# Patient Record
Sex: Male | Born: 1945 | Race: White | Hispanic: No | Marital: Married | Smoking: Current every day smoker
Health system: Southern US, Community
[De-identification: ages and names within clinical notes are randomized; demographics above are authoritative.]

## PROBLEM LIST (undated history)

## (undated) DIAGNOSIS — J45909 Unspecified asthma, uncomplicated: Secondary | ICD-10-CM

## (undated) DIAGNOSIS — K635 Polyp of colon: Secondary | ICD-10-CM

## (undated) DIAGNOSIS — M1990 Unspecified osteoarthritis, unspecified site: Secondary | ICD-10-CM

## (undated) DIAGNOSIS — R0902 Hypoxemia: Secondary | ICD-10-CM

## (undated) DIAGNOSIS — R030 Elevated blood-pressure reading, without diagnosis of hypertension: Secondary | ICD-10-CM

## (undated) DIAGNOSIS — E785 Hyperlipidemia, unspecified: Secondary | ICD-10-CM

## (undated) DIAGNOSIS — M810 Age-related osteoporosis without current pathological fracture: Secondary | ICD-10-CM

## (undated) DIAGNOSIS — IMO0002 Reserved for concepts with insufficient information to code with codable children: Secondary | ICD-10-CM

## (undated) DIAGNOSIS — R040 Epistaxis: Secondary | ICD-10-CM

## (undated) DIAGNOSIS — J449 Chronic obstructive pulmonary disease, unspecified: Secondary | ICD-10-CM

## (undated) DIAGNOSIS — E559 Vitamin D deficiency, unspecified: Secondary | ICD-10-CM

## (undated) HISTORY — DX: Unspecified osteoarthritis, unspecified site: M19.90

## (undated) HISTORY — PX: BILATERAL KNEE ARTHROSCOPY: SUR91

## (undated) HISTORY — DX: Age-related osteoporosis without current pathological fracture: M81.0

## (undated) HISTORY — DX: Epistaxis: R04.0

## (undated) HISTORY — DX: Unspecified asthma, uncomplicated: J45.909

## (undated) HISTORY — DX: Elevated blood-pressure reading, without diagnosis of hypertension: R03.0

## (undated) HISTORY — DX: Reserved for concepts with insufficient information to code with codable children: IMO0002

## (undated) HISTORY — PX: PROSTATE BIOPSY: SHX241

## (undated) HISTORY — DX: Hypoxemia: R09.02

## (undated) HISTORY — DX: Hyperlipidemia, unspecified: E78.5

## (undated) HISTORY — DX: Chronic obstructive pulmonary disease, unspecified: J44.9

## (undated) HISTORY — PX: COLONOSCOPY: SHX174

## (undated) HISTORY — PX: OTHER SURGICAL HISTORY: SHX169

## (undated) HISTORY — DX: Polyp of colon: K63.5

## (undated) HISTORY — DX: Vitamin D deficiency, unspecified: E55.9

---

## 1998-08-28 ENCOUNTER — Emergency Department (HOSPITAL_COMMUNITY): Admission: EM | Admit: 1998-08-28 | Discharge: 1998-08-28 | Payer: Self-pay | Admitting: Emergency Medicine

## 1998-08-29 ENCOUNTER — Encounter: Payer: Self-pay | Admitting: Emergency Medicine

## 2000-04-06 ENCOUNTER — Ambulatory Visit (HOSPITAL_COMMUNITY): Admission: RE | Admit: 2000-04-06 | Discharge: 2000-04-06 | Payer: Self-pay | Admitting: Gastroenterology

## 2003-08-18 ENCOUNTER — Encounter: Admission: RE | Admit: 2003-08-18 | Discharge: 2003-08-18 | Payer: Self-pay | Admitting: Family Medicine

## 2003-08-26 ENCOUNTER — Encounter: Admission: RE | Admit: 2003-08-26 | Discharge: 2003-08-26 | Payer: Self-pay | Admitting: Family Medicine

## 2004-01-13 ENCOUNTER — Encounter: Admission: RE | Admit: 2004-01-13 | Discharge: 2004-01-13 | Payer: Self-pay | Admitting: Family Medicine

## 2004-01-29 ENCOUNTER — Ambulatory Visit: Payer: Self-pay | Admitting: Internal Medicine

## 2004-02-26 ENCOUNTER — Ambulatory Visit: Payer: Self-pay | Admitting: Internal Medicine

## 2004-12-23 ENCOUNTER — Encounter: Admission: RE | Admit: 2004-12-23 | Discharge: 2004-12-23 | Payer: Self-pay | Admitting: Family Medicine

## 2005-09-20 ENCOUNTER — Ambulatory Visit: Payer: Self-pay | Admitting: Family Medicine

## 2005-09-27 ENCOUNTER — Ambulatory Visit: Payer: Self-pay | Admitting: Family Medicine

## 2006-10-03 ENCOUNTER — Ambulatory Visit: Payer: Self-pay | Admitting: Family Medicine

## 2006-10-08 ENCOUNTER — Ambulatory Visit: Payer: Self-pay | Admitting: Gastroenterology

## 2006-10-16 ENCOUNTER — Encounter: Payer: Self-pay | Admitting: Gastroenterology

## 2006-10-16 ENCOUNTER — Ambulatory Visit: Payer: Self-pay | Admitting: Gastroenterology

## 2007-09-02 ENCOUNTER — Ambulatory Visit: Payer: Self-pay | Admitting: Family Medicine

## 2007-10-03 ENCOUNTER — Ambulatory Visit: Payer: Self-pay | Admitting: Family Medicine

## 2007-10-07 ENCOUNTER — Ambulatory Visit: Payer: Self-pay | Admitting: Family Medicine

## 2007-10-07 ENCOUNTER — Encounter: Admission: RE | Admit: 2007-10-07 | Discharge: 2007-10-07 | Payer: Self-pay | Admitting: Family Medicine

## 2008-05-18 ENCOUNTER — Encounter: Admission: RE | Admit: 2008-05-18 | Discharge: 2008-05-18 | Payer: Self-pay | Admitting: Family Medicine

## 2008-05-18 ENCOUNTER — Ambulatory Visit: Payer: Self-pay | Admitting: Family Medicine

## 2008-06-08 ENCOUNTER — Ambulatory Visit: Payer: Self-pay | Admitting: Family Medicine

## 2008-06-25 ENCOUNTER — Ambulatory Visit: Payer: Self-pay | Admitting: Internal Medicine

## 2008-06-25 DIAGNOSIS — J439 Emphysema, unspecified: Secondary | ICD-10-CM

## 2008-07-20 ENCOUNTER — Ambulatory Visit: Payer: Self-pay | Admitting: Internal Medicine

## 2008-08-06 ENCOUNTER — Ambulatory Visit: Payer: Self-pay | Admitting: Family Medicine

## 2008-08-06 ENCOUNTER — Encounter: Admission: RE | Admit: 2008-08-06 | Discharge: 2008-08-06 | Payer: Self-pay | Admitting: Family Medicine

## 2008-10-09 ENCOUNTER — Ambulatory Visit: Payer: Self-pay | Admitting: Family Medicine

## 2008-10-28 ENCOUNTER — Ambulatory Visit: Payer: Self-pay | Admitting: Family Medicine

## 2008-11-03 ENCOUNTER — Ambulatory Visit: Payer: Self-pay | Admitting: Internal Medicine

## 2008-12-07 ENCOUNTER — Ambulatory Visit: Payer: Self-pay | Admitting: Family Medicine

## 2008-12-28 ENCOUNTER — Ambulatory Visit: Payer: Self-pay | Admitting: Family Medicine

## 2009-01-04 ENCOUNTER — Ambulatory Visit: Payer: Self-pay | Admitting: Family Medicine

## 2009-01-13 ENCOUNTER — Encounter: Payer: Self-pay | Admitting: Internal Medicine

## 2009-01-13 ENCOUNTER — Encounter: Admission: RE | Admit: 2009-01-13 | Discharge: 2009-01-13 | Payer: Self-pay | Admitting: Family Medicine

## 2009-01-21 ENCOUNTER — Ambulatory Visit: Payer: Self-pay | Admitting: Family Medicine

## 2009-01-26 ENCOUNTER — Encounter: Admission: RE | Admit: 2009-01-26 | Discharge: 2009-01-26 | Payer: Self-pay | Admitting: Family Medicine

## 2009-03-03 ENCOUNTER — Ambulatory Visit: Payer: Self-pay | Admitting: Internal Medicine

## 2009-03-03 DIAGNOSIS — F17200 Nicotine dependence, unspecified, uncomplicated: Secondary | ICD-10-CM

## 2009-07-02 ENCOUNTER — Ambulatory Visit: Payer: Self-pay | Admitting: Internal Medicine

## 2009-10-11 ENCOUNTER — Ambulatory Visit: Payer: Self-pay | Admitting: Physician Assistant

## 2009-10-25 ENCOUNTER — Ambulatory Visit: Payer: Self-pay | Admitting: Family Medicine

## 2009-10-27 ENCOUNTER — Ambulatory Visit: Payer: Self-pay | Admitting: Internal Medicine

## 2009-11-16 ENCOUNTER — Ambulatory Visit: Payer: Self-pay | Admitting: Family Medicine

## 2010-02-21 ENCOUNTER — Ambulatory Visit
Admission: RE | Admit: 2010-02-21 | Discharge: 2010-02-21 | Payer: Self-pay | Source: Home / Self Care | Attending: Family Medicine | Admitting: Family Medicine
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
## 2010-02-22 NOTE — Miscellaneous (Signed)
Summary: Orders Update pft charges  Clinical Lists Changes  Orders: Added new Service order of Carbon Monoxide diffusing w/capacity (94720) - Signed Added new Service order of Lung Volumes (94240) - Signed Added new Service order of Spirometry (Pre & Post) (94060) - Signed 

## 2010-03-04 ENCOUNTER — Ambulatory Visit
Admission: RE | Admit: 2010-03-04 | Discharge: 2010-03-04 | Disposition: A | Discharge status: Home / Self Care | Payer: Managed Care, Other (non HMO) | Source: Ambulatory Visit | Attending: Internal Medicine | Admitting: Internal Medicine

## 2010-03-04 ENCOUNTER — Other Ambulatory Visit: Payer: Self-pay | Admitting: Internal Medicine

## 2010-03-04 MED ORDER — IOHEXOL 300 MG/ML  SOLN
100.0000 mL | Freq: Once | INTRAMUSCULAR | Status: AC | PRN
  Administered 2010-03-04: 100 mL via INTRAVENOUS

## 2010-03-15 ENCOUNTER — Other Ambulatory Visit: Payer: Self-pay | Admitting: Internal Medicine

## 2010-03-15 ENCOUNTER — Ambulatory Visit
Admission: RE | Admit: 2010-03-15 | Discharge: 2010-03-15 | Disposition: A | Discharge status: Home / Self Care | Payer: Managed Care, Other (non HMO) | Source: Ambulatory Visit | Attending: Internal Medicine | Admitting: Internal Medicine

## 2010-03-15 DIAGNOSIS — R079 Chest pain, unspecified: Secondary | ICD-10-CM

## 2010-03-25 ENCOUNTER — Other Ambulatory Visit: Payer: Self-pay | Admitting: Oncology

## 2010-03-25 ENCOUNTER — Encounter (HOSPITAL_BASED_OUTPATIENT_CLINIC_OR_DEPARTMENT_OTHER): Payer: Managed Care, Other (non HMO) | Admitting: Oncology

## 2010-03-25 DIAGNOSIS — D72829 Elevated white blood cell count, unspecified: Secondary | ICD-10-CM

## 2010-03-25 LAB — CBC WITH DIFFERENTIAL/PLATELET
BASO%: 0.4 % (ref 0.0–2.0)
EOS%: 4.5 % (ref 0.0–7.0)
Eosinophils Absolute: 0.3 10*3/uL (ref 0.0–0.5)
LYMPH%: 20.8 % (ref 14.0–49.0)
MCH: 32.4 pg (ref 27.2–33.4)
MCHC: 33.9 g/dL (ref 32.0–36.0)
MCV: 95.4 fL (ref 79.3–98.0)
MONO%: 13.3 % (ref 0.0–14.0)
NEUT#: 3.6 10*3/uL (ref 1.5–6.5)
Platelets: 166 10*3/uL (ref 140–400)
RBC: 4.03 10*6/uL — ABNORMAL LOW (ref 4.20–5.82)
RDW: 13.4 % (ref 11.0–14.6)

## 2010-03-25 LAB — COMPREHENSIVE METABOLIC PANEL
ALT: 19 U/L (ref 0–53)
AST: 22 U/L (ref 0–37)
Alkaline Phosphatase: 91 U/L (ref 39–117)
CO2: 28 mEq/L (ref 19–32)
Creatinine, Ser: 1.18 mg/dL (ref 0.40–1.50)
Sodium: 143 mEq/L (ref 135–145)
Total Bilirubin: 0.4 mg/dL (ref 0.3–1.2)
Total Protein: 7.1 g/dL (ref 6.0–8.3)

## 2010-03-25 LAB — CHCC SMEAR

## 2010-03-25 LAB — LACTATE DEHYDROGENASE: LDH: 187 U/L (ref 94–250)

## 2010-04-01 ENCOUNTER — Other Ambulatory Visit: Payer: Self-pay | Admitting: Internal Medicine

## 2010-04-01 ENCOUNTER — Ambulatory Visit
Admission: RE | Admit: 2010-04-01 | Discharge: 2010-04-01 | Disposition: A | Discharge status: Home / Self Care | Payer: Managed Care, Other (non HMO) | Source: Ambulatory Visit | Attending: Internal Medicine | Admitting: Internal Medicine

## 2010-04-01 DIAGNOSIS — R0602 Shortness of breath: Secondary | ICD-10-CM

## 2010-04-01 MED ORDER — IOHEXOL 300 MG/ML  SOLN
75.0000 mL | Freq: Once | INTRAMUSCULAR | Status: AC | PRN
  Administered 2010-04-01: 75 mL via INTRAVENOUS

## 2010-04-05 ENCOUNTER — Other Ambulatory Visit (HOSPITAL_COMMUNITY): Payer: Self-pay | Admitting: Internal Medicine

## 2010-04-05 DIAGNOSIS — S22009A Unspecified fracture of unspecified thoracic vertebra, initial encounter for closed fracture: Secondary | ICD-10-CM

## 2010-04-05 DIAGNOSIS — Z0000 Encounter for general adult medical examination without abnormal findings: Secondary | ICD-10-CM

## 2010-04-08 ENCOUNTER — Ambulatory Visit (HOSPITAL_COMMUNITY)
Admission: RE | Admit: 2010-04-08 | Discharge: 2010-04-08 | Disposition: A | Discharge status: Home / Self Care | Payer: Managed Care, Other (non HMO) | Source: Ambulatory Visit | Attending: Internal Medicine | Admitting: Internal Medicine

## 2010-04-08 DIAGNOSIS — Z0000 Encounter for general adult medical examination without abnormal findings: Secondary | ICD-10-CM

## 2010-04-08 DIAGNOSIS — S22009A Unspecified fracture of unspecified thoracic vertebra, initial encounter for closed fracture: Secondary | ICD-10-CM | POA: Insufficient documentation

## 2010-04-15 ENCOUNTER — Other Ambulatory Visit (HOSPITAL_COMMUNITY): Payer: Self-pay | Admitting: Orthopedic Surgery

## 2010-04-15 DIAGNOSIS — IMO0002 Reserved for concepts with insufficient information to code with codable children: Secondary | ICD-10-CM

## 2010-04-24 HISTORY — PX: BACK SURGERY: SHX140

## 2010-04-27 ENCOUNTER — Ambulatory Visit (HOSPITAL_COMMUNITY): Admission: RE | Admit: 2010-04-27 | Payer: Managed Care, Other (non HMO) | Source: Ambulatory Visit

## 2010-04-27 ENCOUNTER — Ambulatory Visit (HOSPITAL_COMMUNITY)
Admission: RE | Admit: 2010-04-27 | Discharge: 2010-04-27 | Disposition: A | Discharge status: Home / Self Care | Payer: Managed Care, Other (non HMO) | Source: Ambulatory Visit | Attending: Orthopedic Surgery | Admitting: Orthopedic Surgery

## 2010-04-27 DIAGNOSIS — Y929 Unspecified place or not applicable: Secondary | ICD-10-CM | POA: Insufficient documentation

## 2010-04-27 DIAGNOSIS — IMO0002 Reserved for concepts with insufficient information to code with codable children: Secondary | ICD-10-CM

## 2010-04-27 DIAGNOSIS — S22009A Unspecified fracture of unspecified thoracic vertebra, initial encounter for closed fracture: Secondary | ICD-10-CM | POA: Insufficient documentation

## 2010-04-27 DIAGNOSIS — X58XXXA Exposure to other specified factors, initial encounter: Secondary | ICD-10-CM | POA: Insufficient documentation

## 2010-04-27 DIAGNOSIS — M546 Pain in thoracic spine: Secondary | ICD-10-CM | POA: Insufficient documentation

## 2010-04-27 MED ORDER — TECHNETIUM TC 99M MEDRONATE IV KIT
23.6000 | PACK | Freq: Once | INTRAVENOUS | Status: AC | PRN
  Administered 2010-04-27: 23.6 via INTRAVENOUS

## 2010-05-04 ENCOUNTER — Encounter (HOSPITAL_COMMUNITY)
Admission: RE | Admit: 2010-05-04 | Discharge: 2010-05-04 | Disposition: A | Discharge status: Home / Self Care | Payer: Managed Care, Other (non HMO) | Source: Ambulatory Visit | Attending: Orthopedic Surgery | Admitting: Orthopedic Surgery

## 2010-05-04 LAB — DIFFERENTIAL
Eosinophils Relative: 5 % (ref 0–5)
Lymphocytes Relative: 22 % (ref 12–46)
Lymphs Abs: 1.7 10*3/uL (ref 0.7–4.0)
Monocytes Relative: 16 % — ABNORMAL HIGH (ref 3–12)

## 2010-05-04 LAB — COMPREHENSIVE METABOLIC PANEL
ALT: 16 U/L (ref 0–53)
Albumin: 4.1 g/dL (ref 3.5–5.2)
Alkaline Phosphatase: 68 U/L (ref 39–117)
Chloride: 104 mEq/L (ref 96–112)
Glucose, Bld: 79 mg/dL (ref 70–99)
Potassium: 4.6 mEq/L (ref 3.5–5.1)
Sodium: 140 mEq/L (ref 135–145)
Total Bilirubin: 0.8 mg/dL (ref 0.3–1.2)
Total Protein: 6.6 g/dL (ref 6.0–8.3)

## 2010-05-04 LAB — PROTIME-INR
INR: 0.93 (ref 0.00–1.49)
Prothrombin Time: 12.7 seconds (ref 11.6–15.2)

## 2010-05-04 LAB — CBC
HCT: 41 % (ref 39.0–52.0)
Hemoglobin: 14 g/dL (ref 13.0–17.0)
MCHC: 34.1 g/dL (ref 30.0–36.0)
MCV: 95.1 fL (ref 78.0–100.0)
RDW: 13 % (ref 11.5–15.5)

## 2010-05-04 LAB — SURGICAL PCR SCREEN

## 2010-05-05 ENCOUNTER — Ambulatory Visit (HOSPITAL_COMMUNITY): Payer: Managed Care, Other (non HMO)

## 2010-05-05 ENCOUNTER — Ambulatory Visit (HOSPITAL_COMMUNITY)
Admission: RE | Admit: 2010-05-05 | Discharge: 2010-05-05 | Disposition: A | Discharge status: Home / Self Care | Payer: Managed Care, Other (non HMO) | Source: Ambulatory Visit | Attending: Orthopedic Surgery | Admitting: Orthopedic Surgery

## 2010-05-05 DIAGNOSIS — Y929 Unspecified place or not applicable: Secondary | ICD-10-CM | POA: Insufficient documentation

## 2010-05-05 DIAGNOSIS — J449 Chronic obstructive pulmonary disease, unspecified: Secondary | ICD-10-CM | POA: Insufficient documentation

## 2010-05-05 DIAGNOSIS — J4489 Other specified chronic obstructive pulmonary disease: Secondary | ICD-10-CM | POA: Insufficient documentation

## 2010-05-05 DIAGNOSIS — X58XXXA Exposure to other specified factors, initial encounter: Secondary | ICD-10-CM | POA: Insufficient documentation

## 2010-05-05 DIAGNOSIS — S22009A Unspecified fracture of unspecified thoracic vertebra, initial encounter for closed fracture: Secondary | ICD-10-CM | POA: Insufficient documentation

## 2010-05-05 LAB — URINALYSIS, ROUTINE W REFLEX MICROSCOPIC
Bilirubin Urine: NEGATIVE
Glucose, UA: NEGATIVE mg/dL
Ketones, ur: NEGATIVE mg/dL
Protein, ur: NEGATIVE mg/dL
pH: 6.5 (ref 5.0–8.0)

## 2010-05-06 LAB — TYPE AND SCREEN
DAT, IgG: NEGATIVE
PT AG Type: NEGATIVE
Unit division: 0

## 2010-05-06 NOTE — Op Note (Signed)
NAMECamerin, [name]                   ACCOUNT NO.:  [id number]  MEDICAL RECORD NO.:  [id number]           PATIENT TYPE:  O  LOCATION:  [facility]                         FACILITY:  [facility]  PHYSICIAN:  Estill Bamberg, MD      DATE OF BIRTH:  13-Oct-1945  DATE OF PROCEDURE:  05/05/2010 DATE OF DISCHARGE:  05/05/2010                              OPERATIVE REPORT   PREOPERATIVE DIAGNOSIS:  T8 compression fracture.  POSTOPERATIVE DIAGNOSIS:  T8 compression fracture.  PROCEDURE:  T8 kyphoplasty.  SURGEON:  Estill Bamberg, MD  ASSISTANT:  None.  ANESTHESIA:  General endotracheal anesthesia.  ESTIMATED BLOOD LOSS:  Minimal.  INDICATIONS FOR PROCEDURE:  Briefly, [name] is an extremely pleasant 65 year old male who presented to my office with severe pain in his low back.  The patient stated that his pain began in approximately beginning of February.  He was unsure of the etiology.  However, I did review a CAT scan which was consistent with a T8 and a T12 compression fracture. I was unsure as to the acuity of each fracture and I therefore did go forward with a bone scan.  The bone scan did show activity at the T8 vertebral body.  The T12 vertebral body did not show any activity and therefore I did diagnose him with an acute T8 compression fracture.  I did discuss continued conservative treatment versus nonoperative measures and the patient wished to go forward with kyphoplasty.  The patient did understand the risks and limitations of the procedure as outlined in my preoperative note.  OPERATIVE DETAILS:  On May 04, 2009, the patient was brought to surgery and general endotracheal anesthesia was administered.  The patient was placed prone on a well-padded Jackson frame.  Bumps were placed over the patient's chest and hips.  AP and lateral fluoroscopy was brought in.  The T8 vertebral body was visualized well on both AP and lateral views.  I then made two stab incisions at the  superior lateral aspect of the pedicles at the T8 level.  Jamshidi needles were advanced through the T8 pedicles on both the right and the left sides in the usual manner.  The kyphoplasty balloons were inflated with approximately 2 mL of contrast.  It did take a high degree of pressure to get a 2 mL of contrast.  It did appear that the patient's bone was rather sclerotic.  Of note, I did use a curette and a drill prior to going forward with inflation of the balloons.  Once the pressure of the balloons reached 400 bilaterally, I removed the balloons and cement was advanced down the pedicles.  A total of approximately 1 mL of cement on both the right and the left sides were used.  I did note excellent interdigitation of the spine and T8 vertebral body.  There was no extravasation of the cement identified posteriorly or any significant extent to the vertebral disks above and below.  I then observed the patient for additional 5 minutes while the cement was hardening.  The Jamshidi were removed.  Final AP and lateral fluoroscopic views were obtained.  The incisions were closed using Monocryl and Dermabond.  The patient was then awoken from general endotracheal anesthesia and transferred to recovery in stable condition.     Estill Bamberg, MD     MD/MEDQ  D:  05/05/2010  T:  05/06/2010  Job:  [id number]  Electronically Signed by Estill Bamberg  on 05/06/2010 09:10:47 AM

## 2010-06-22 ENCOUNTER — Encounter: Payer: Self-pay | Admitting: Internal Medicine

## 2010-06-23 ENCOUNTER — Ambulatory Visit (INDEPENDENT_AMBULATORY_CARE_PROVIDER_SITE_OTHER): Payer: BC Managed Care – PPO | Admitting: Internal Medicine

## 2010-06-23 ENCOUNTER — Encounter: Payer: Self-pay | Admitting: Internal Medicine

## 2010-06-23 VITALS — BP 120/82 | HR 98 | Ht 71.0 in | Wt 153.2 lb

## 2010-06-23 DIAGNOSIS — M4850XA Collapsed vertebra, not elsewhere classified, site unspecified, initial encounter for fracture: Secondary | ICD-10-CM

## 2010-06-23 DIAGNOSIS — F17200 Nicotine dependence, unspecified, uncomplicated: Secondary | ICD-10-CM

## 2010-06-23 DIAGNOSIS — J449 Chronic obstructive pulmonary disease, unspecified: Secondary | ICD-10-CM

## 2010-06-23 DIAGNOSIS — IMO0002 Reserved for concepts with insufficient information to code with codable children: Secondary | ICD-10-CM

## 2010-06-23 DIAGNOSIS — B370 Candidal stomatitis: Secondary | ICD-10-CM

## 2010-06-23 MED ORDER — IPRATROPIUM-ALBUTEROL 0.5-2.5 (3) MG/3ML IN SOLN: 3.0000 mL | RESPIRATORY_TRACT | Status: DC | PRN

## 2010-06-23 MED ORDER — MOMETASONE FUROATE 50 MCG/ACT NA SUSP: 2.0000 | Freq: Every day | NASAL | Status: DC

## 2010-06-23 MED ORDER — FLUCONAZOLE 150 MG PO TABS: 150.0000 mg | ORAL_TABLET | Freq: Once | ORAL | Status: AC

## 2010-06-23 NOTE — Assessment & Plan Note (Addendum)
Down to 2 cigs/ day- trying to quit. Support and reinforcement given.

## 2010-06-23 NOTE — Assessment & Plan Note (Addendum)
Thrush likely explains current hoarseness that waxes and wanes. We will give diflucan

## 2010-06-23 NOTE — Progress Notes (Addendum)
  Subjective:    Patient ID: [name], male    DOB: August 08, 1945, 66 y.o.   MRN: [id number]  HPI 06/23/10- 61 yo tobacco user 1/2 ppd, followed for COPD   here with dtr. Last here October 27, 2009. Since then had back surgery -did ok. For last 2-3 weeks had been tighter through chest, difficult to clear thick mucus without fever, green or sore throat. Just feels he can't clear mucus. Has had 2 steroid shots for this this in past month. Taking doxycycline and just finished prednisone. Neb with albuterol opens him only transiently. DOE but comfortable at rest. Denies chest pain. Has had a chest CT at [facility]. CT w/cm 04/01/10- emphysema, NAD, left apical scarring, compression fx's T8, T12 Nuclear bone scan 04/27/10- uptake c/w T8 vertebral compression fx. Review of Systems Constitutional:   No weight loss, night sweats,  Fevers, chills, fatigue, lassitude. HEENT:   No headaches,  Difficulty swallowing,  Tooth/dental problems,  Sore throat,                No sneezing, itching, ear ache, nasal congestion, post nasal drip,   CV:  No chest pain,  Orthopnea, PND, swelling in lower extremities, anasarca, dizziness, palpitations  GI  No heartburn, indigestion, abdominal pain, nausea, vomiting, diarrhea, change in bowel habits, loss of appetite  Resp:.  No excess mucus, no productive cough,  Pos- non-productive cough,  No coughing up of blood.  No change in color of mucus.  No wheezing.    Skin: no rash or lesions.  GU: no dysuria, change in color of urine, no urgency or frequency.  No flank pain.  MS:  No joint pain or swelling.  No decreased range of motion.   Psych:  No change in mood or affect. No depression or anxiety.  No memory loss.      Objective:   Physical Exam General- Alert, Oriented, Affect-appropriate, Distress- none acute  Skin- rash-none, lesions- none, excoriation- none  Lymphadenopathy- none  Head- atraumatic  Eyes- Gross vision intact, PERRLA, conjunctivae clear  secretions  Ears- Hearing, canals, Tm- normal  Nose- Clear, No- Septal dev, mucus, polyps, erosion, perforation   Throat- Mallampati II , mucosa clear , drainage- none, tonsils- atrophic  Very hoarse  With Thrush  Neck- flexible , trachea midline, no stridor , thyroid nl, carotid no bruit  Chest - symmetrical excursion , unlabored     Heart/CV- RRR , no murmur , no gallop  , no rub, nl s1 s2                     - JVD- none , edema- none, stasis changes- none, varices- none     Lung- diminished with rhonchi right >left chest, unlabored.                 wheeze- none, cough- none , dullness-none, rub- none     Chest wall-  Abd- tender-no, distended-no, bowel sounds-present, HSM- no  Br/ Gen/ Rectal- Not done, not indicated  Extrem- cyanosis- none, clubbing, none, atrophy- none, strength- nl  Neuro- grossly intact to observation          Assessment & Plan:

## 2010-06-23 NOTE — Patient Instructions (Signed)
Please don't smoke!  Scripts for fluconazole for thrush/ yeast infection in mouth, Nasonex and nebulizer solution sent to your drug store.   Finish the antibiotic.

## 2010-06-23 NOTE — Assessment & Plan Note (Addendum)
Moderately severe COPD with subacute exacerbation felt as tightness with little response to current meds.  We discussed potential role of thoracic volume loss associated with vertebral compression fxs as contributing to dyspnea.

## 2010-06-25 DIAGNOSIS — M4850XA Collapsed vertebra, not elsewhere classified, site unspecified, initial encounter for fracture: Secondary | ICD-10-CM | POA: Insufficient documentation

## 2010-07-08 ENCOUNTER — Other Ambulatory Visit: Payer: Self-pay | Admitting: Internal Medicine

## 2010-07-19 ENCOUNTER — Telehealth: Payer: Self-pay | Admitting: Internal Medicine

## 2010-07-19 MED ORDER — TIOTROPIUM BROMIDE MONOHYDRATE 18 MCG IN CAPS: 18.0000 ug | ORAL_CAPSULE | Freq: Every day | RESPIRATORY_TRACT | Status: DC

## 2010-07-19 MED ORDER — BUDESONIDE-FORMOTEROL FUMARATE 160-4.5 MCG/ACT IN AERO: 2.0000 | INHALATION_SPRAY | Freq: Two times a day (BID) | RESPIRATORY_TRACT | Status: DC

## 2010-07-19 MED ORDER — ALBUTEROL SULFATE HFA 108 (90 BASE) MCG/ACT IN AERS: 2.0000 | INHALATION_SPRAY | Freq: Four times a day (QID) | RESPIRATORY_TRACT | Status: DC | PRN

## 2010-07-19 NOTE — Telephone Encounter (Signed)
Rx refills for spiriva, symbicort and proair sent to pharm. Spoke with pt and notified that this has been done.

## 2010-09-24 LAB — HM DEXA SCAN

## 2010-10-26 ENCOUNTER — Ambulatory Visit: Payer: Self-pay | Admitting: Internal Medicine

## 2011-05-29 ENCOUNTER — Other Ambulatory Visit: Payer: Self-pay | Admitting: Internal Medicine

## 2011-07-06 ENCOUNTER — Ambulatory Visit (INDEPENDENT_AMBULATORY_CARE_PROVIDER_SITE_OTHER): Payer: BC Managed Care – PPO | Admitting: Internal Medicine

## 2011-07-06 ENCOUNTER — Ambulatory Visit (INDEPENDENT_AMBULATORY_CARE_PROVIDER_SITE_OTHER)
Admission: RE | Admit: 2011-07-06 | Discharge: 2011-07-06 | Disposition: A | Discharge status: Home / Self Care | Payer: BC Managed Care – PPO | Source: Ambulatory Visit | Attending: Internal Medicine | Admitting: Internal Medicine

## 2011-07-06 ENCOUNTER — Encounter: Payer: Self-pay | Admitting: Internal Medicine

## 2011-07-06 VITALS — BP 128/90 | HR 80 | Ht 71.0 in | Wt 156.2 lb

## 2011-07-06 DIAGNOSIS — B370 Candidal stomatitis: Secondary | ICD-10-CM

## 2011-07-06 DIAGNOSIS — J449 Chronic obstructive pulmonary disease, unspecified: Secondary | ICD-10-CM

## 2011-07-06 DIAGNOSIS — J4489 Other specified chronic obstructive pulmonary disease: Secondary | ICD-10-CM

## 2011-07-06 DIAGNOSIS — F17200 Nicotine dependence, unspecified, uncomplicated: Secondary | ICD-10-CM

## 2011-07-06 MED ORDER — MOMETASONE FUROATE 50 MCG/ACT NA SUSP: 2.0000 | NASAL | Status: DC | PRN

## 2011-07-06 MED ORDER — IPRATROPIUM-ALBUTEROL 0.5-2.5 (3) MG/3ML IN SOLN: 3.0000 mL | RESPIRATORY_TRACT | Status: DC | PRN

## 2011-07-06 MED ORDER — ALBUTEROL SULFATE HFA 108 (90 BASE) MCG/ACT IN AERS: 2.0000 | INHALATION_SPRAY | Freq: Four times a day (QID) | RESPIRATORY_TRACT | Status: DC | PRN

## 2011-07-06 MED ORDER — BUDESONIDE-FORMOTEROL FUMARATE 160-4.5 MCG/ACT IN AERO: 2.0000 | INHALATION_SPRAY | Freq: Two times a day (BID) | RESPIRATORY_TRACT | Status: DC

## 2011-07-06 MED ORDER — AEROCHAMBER MV MISC: Status: AC

## 2011-07-06 NOTE — Patient Instructions (Addendum)
Please try harder to stop smoking- you know it can't be helping you any.  Order CXR  Dx COPD  Order- Aerochamber spacer for use with Symbicort  To reduce thrush  Meds refilled.

## 2011-07-06 NOTE — Progress Notes (Signed)
Subjective:    Patient ID: [name], male    DOB: 1945/11/29, 66 y.o.   MRN: [id number]  HPI 06/23/10- 38 yo tobacco user 1/2 ppd, followed for COPD   here with dtr. Last here October 27, 2009. Since then had back surgery -did ok. For last 2-3 weeks had been tighter through chest, difficult to clear thick mucus without fever, green or sore throat. Just feels he can't clear mucus. Has had 2 steroid shots for this this in past month. Taking doxycycline and just finished prednisone. Neb with albuterol opens him only transiently. DOE but comfortable at rest. Denies chest pain. Has had a chest CT at [facility]. CT w/cm 04/01/10- emphysema, NAD, left apical scarring, compression fx's T8, T12 Nuclear bone scan 04/27/10- uptake c/w T8 vertebral compression fx.  07/06/11- 89 yo tobacco user 1/2 ppd, followed for COPD   here with dtr.  Patient states about the same as last visit. c/o  occasional sob and wheezing. Denies chest pain and chest tightness.  He is still smoking 3 packs per week, but considers this improved. We discussed cessation again.  He reports 3 episodes of thrush, using Symbicort. Discussed mouth care and aerochamber. DOE limits stairs and brisk walk. Cough productive clear white. Denies chest pain, blood. Sensitive to weather, cold, humidity.  ROS-see HPI Constitutional:   No-   weight loss, night sweats, fevers, chills, fatigue, lassitude. HEENT:   No-  headaches, difficulty swallowing, tooth/dental problems, sore throat,       No-  sneezing, itching, ear ache, nasal congestion, post nasal drip,  CV:  No-   chest pain, orthopnea, PND, swelling in lower extremities, anasarca, dizziness, palpitations Resp: + shortness of breath with exertion or at rest.              + productive cough,  No non-productive cough,  No- coughing up of blood.              No-   change in color of mucus.  + wheezing.   Skin: No-   rash or lesions. GI:  No-   heartburn, indigestion, abdominal pain, nausea, vomiting,    GU:  MS:  No-   joint pain or swelling.  No- decreased range of motion.  No- back pain. Neuro-     nothing unusual Psych:  No- change in mood or affect. No depression or anxiety.  No memory loss.  OBJ- Physical Exam General- Alert, Oriented, Affect-appropriate, Distress- none acute, slender Skin- rash-none, lesions- none, excoriation- none Lymphadenopathy- none Head- atraumatic            Eyes- Gross vision intact, PERRLA, conjunctivae and secretions clear            Ears- Hearing, canals-normal            Nose- clear, no-Septal dev, mucus, polyps, erosion, perforation             Throat- Mallampati II , mucosa clear , drainage- none, tonsils- atrophic Neck- flexible , trachea midline, no stridor , thyroid nl, carotid no bruit Chest - symmetrical excursion , unlabored           Heart/CV- RRR , no murmur , no gallop  , no rub, nl s1 s2                           - JVD- none , edema- none, stasis changes- none, varices- none  Lung- coarse unlabored wheeze, cough- none , dullness-none, rub- none           Chest wall-  Abd-  Br/ Gen/ Rectal- Not done, not indicated Extrem- cyanosis- none, clubbing, none, atrophy- none, strength- nl Neuro- grossly intact to observation

## 2011-07-06 NOTE — Assessment & Plan Note (Signed)
Discussed ways to clear mouth. Will add spacer to his Symbicort.

## 2011-07-06 NOTE — Assessment & Plan Note (Signed)
Severe COPD with chronic bronchitis. Discussed and refilled several meds. Emphasized smoking cessation.

## 2011-07-06 NOTE — Assessment & Plan Note (Signed)
We have reviewed techniques and support. He is again encouraged, with family here, to stop.

## 2011-07-13 NOTE — Progress Notes (Signed)
Quick Note:  Pt aware of results. ______ 

## 2011-11-10 ENCOUNTER — Encounter: Payer: Self-pay | Admitting: Gastroenterology

## 2011-11-14 ENCOUNTER — Encounter: Payer: Self-pay | Admitting: Gastroenterology

## 2011-12-04 ENCOUNTER — Ambulatory Visit (AMBULATORY_SURGERY_CENTER): Payer: BC Managed Care – PPO | Admitting: *Deleted

## 2011-12-04 ENCOUNTER — Encounter: Payer: Self-pay | Admitting: Gastroenterology

## 2011-12-04 VITALS — Ht 69.5 in | Wt 159.4 lb

## 2011-12-04 DIAGNOSIS — Z860100 Personal history of colon polyps, unspecified: Secondary | ICD-10-CM

## 2011-12-04 DIAGNOSIS — Z1211 Encounter for screening for malignant neoplasm of colon: Secondary | ICD-10-CM

## 2011-12-04 MED ORDER — NA SULFATE-K SULFATE-MG SULF 17.5-3.13-1.6 GM/177ML PO SOLN
ORAL | Status: DC
Start: 2011-12-04 — End: 2011-12-18

## 2011-12-04 NOTE — Progress Notes (Signed)
Patient states he has 2 metal clamps in forehead from baseball trauma, he states he is UNABLE to have MRI's.

## 2011-12-18 ENCOUNTER — Ambulatory Visit (AMBULATORY_SURGERY_CENTER): Payer: BC Managed Care – PPO | Admitting: Gastroenterology

## 2011-12-18 ENCOUNTER — Encounter: Payer: Self-pay | Admitting: Gastroenterology

## 2011-12-18 VITALS — BP 123/76 | HR 73 | Temp 98.3°F | Resp 16 | Ht 69.5 in | Wt 159.0 lb

## 2011-12-18 DIAGNOSIS — D126 Benign neoplasm of colon, unspecified: Secondary | ICD-10-CM

## 2011-12-18 DIAGNOSIS — Z1211 Encounter for screening for malignant neoplasm of colon: Secondary | ICD-10-CM

## 2011-12-18 MED ORDER — SODIUM CHLORIDE 0.9 % IV SOLN: 500.0000 mL | INTRAVENOUS | Status: DC

## 2011-12-18 NOTE — Progress Notes (Signed)
Inhaler from home 1 puff (PROAIR) 0900.

## 2011-12-18 NOTE — Progress Notes (Addendum)
Patient did not have preoperative order for IV antibiotic SSI prophylaxis. (G8918)  Patient did not experience any of the following events: a burn prior to discharge; a fall within the facility; wrong site/side/patient/procedure/implant event; or a hospital transfer or hospital admission upon discharge from the facility. (G8907)  

## 2011-12-18 NOTE — Op Note (Signed)
[facility] [address] [place], [address]   COLONOSCOPY PROCEDURE REPORT  PATIENT: [name], [name]  MR#: [id number] BIRTHDATE: 18-Mar-1945 , 66  yrs. old GENDER: Male ENDOSCOPIST: Louis Meckel, MD REFERRED [id number] Althea Charon, M.D. PROCEDURE DATE:  12/18/2011 PROCEDURE:   Colonoscopy with snare polypectomy, Colonoscopy with cold biopsy polypectomy, and Submucosal injection, any substance  ASA CLASS:   Class II INDICATIONS: MEDICATIONS: MAC sedation, administered by CRNA and propofol (Diprivan) 400mg  IV  DESCRIPTION OF PROCEDURE:   After the risks benefits and alternatives of the procedure were thoroughly explained, informed consent was obtained.  A digital rectal exam revealed no abnormalities of the rectum.   The LB [id number]  endoscope was introduced through the anus and advanced to the cecum, which was identified by both the appendix and ileocecal valve. No adverse events experienced.   The quality of the prep was Suprep excellent The instrument was then slowly withdrawn as the colon was fully examined.      COLON FINDINGS: 12 polyps were seen in the ascending, transverse, descending colon and sigmoid.  The largest polyp, measured about 15 mm and was flat and located in the hepatic flexure.  3 cc normal saline was injected submucosally in the polyp which was removed with hot polypectomy snare.  2 2 mm polyps were removed with biopsy forceps.  The remaining polyps were removed by cold polypectomy snare.  There were 2 polyps in the ascending colon, 2 polyps in the proximal transverse colon, 3 polyps in the descending colon and a single polyp in the sigmoid.  the remainder of the colon was normal.   12 polyps were seen in the ascending, transverse, descending colon and sigmoid.  The largest polyp, measured about 15 mm and was flat and located in the hepatic flexure.  3 cc normal saline was injected submucosally in the polyp which was  removed with hot polypectomy snare.  2 2 mm polyps were removed with biopsy forceps.  The remaining polyps were removed by cold polypectomy snare.  There were 2 polyps in the ascending colon, 2 polyps in the proximal transverse colon, 3 polyps in the descending colon and a single polyp in the sigmoid.  the remainder of the colon was normal.  Retroflexed views revealed no abnormalities. The time to cecum=2 minutes 37 seconds.  Withdrawal time=24 minutes 40 seconds. The scope was withdrawn and the procedure completed. COMPLICATIONS: There were no complications.  ENDOSCOPIC IMPRESSION: Colonic polyposis  RECOMMENDATIONS: colonoscopy 1 year  eSigned:  Louis Meckel, MD 12/18/2011 10:35 AM   cc:   PATIENT NAME:  [name], [name] MR#: [id number]

## 2011-12-18 NOTE — Patient Instructions (Addendum)
YOU HAD AN ENDOSCOPIC PROCEDURE TODAY AT THE [facility]: Refer to the procedure report that was given to you for any specific questions about what was found during the examination.  If the procedure report does not answer your questions, please call your gastroenterologist to clarify.  If you requested that your care partner not be given the details of your procedure findings, then the procedure report has been included in a sealed envelope for you to review at your convenience later.  YOU SHOULD EXPECT: Some feelings of bloating in the abdomen. Passage of more gas than usual.  Walking can help get rid of the air that was put into your GI tract during the procedure and reduce the bloating. If you had a lower endoscopy (such as a colonoscopy or flexible sigmoidoscopy) you may notice spotting of blood in your stool or on the toilet paper. If you underwent a bowel prep for your procedure, then you may not have a normal bowel movement for a few days.  DIET: Your first meal following the procedure should be a light meal and then it is ok to progress to your normal diet.  A half-sandwich or bowl of soup is an example of a good first meal.  Heavy or fried foods are harder to digest and may make you feel nauseous or bloated.  Likewise meals heavy in dairy and vegetables can cause extra gas to form and this can also increase the bloating.  Drink plenty of fluids but you should avoid alcoholic beverages for 24 hours.  ACTIVITY: Your care partner should take you home directly after the procedure.  You should plan to take it easy, moving slowly for the rest of the day.  You can resume normal activity the day after the procedure however you should NOT DRIVE or use heavy machinery for 24 hours (because of the sedation medicines used during the test).    SYMPTOMS TO REPORT IMMEDIATELY: A gastroenterologist can be reached at any hour.  During normal business hours, 8:30 AM to 5:00 PM Monday through Friday,  call [phone].  After hours and on weekends, please call the GI answering service at [phone] who will take a message and have the physician on call contact you.   Following lower endoscopy (colonoscopy or flexible sigmoidoscopy):   No Aspirin nor NSAIDS x 2 weeks post procedure  Excessive amounts of blood in the stool  Significant tenderness or worsening of abdominal pains  Swelling of the abdomen that is new, acute  Fever of 100F or higher  FOLLOW UP: If any biopsies were taken you will be contacted by phone or by letter within the next 1-3 weeks.  Call your gastroenterologist if you have not heard about the biopsies in 3 weeks.  Our staff will call the home number listed on your records the next business day following your procedure to check on you and address any questions or concerns that you may have at that time regarding the information given to you following your procedure. This is a courtesy call and so if there is no answer at the home number and we have not heard from you through the emergency physician on call, we will assume that you have returned to your regular daily activities without incident.  SIGNATURES/CONFIDENTIALITY: You and/or your care partner have signed paperwork which will be entered into your electronic medical record.  These signatures attest to the fact that that the information above on your After Visit Summary has been reviewed  and is understood.  Full responsibility of the confidentiality of this discharge information lies with you and/or your care-partner.   Thank-you for choosing [place] for your medical care.

## 2011-12-18 NOTE — Progress Notes (Signed)
Patient states he used both inhalers and did Breathing treatment this morning when he woke up due to mucus. No respiratory distress noted at this time.

## 2011-12-19 ENCOUNTER — Telehealth: Payer: Self-pay | Admitting: *Deleted

## 2011-12-19 NOTE — Telephone Encounter (Signed)
  Follow up Call-  Call back number 12/18/2011  Post procedure Call Back phone  # [phone]  Permission to leave phone message Yes     Patient questions:  Do you have a fever, pain , or abdominal swelling? no Pain Score  0 *  Have you tolerated food without any problems? yes  Have you been able to return to your normal activities? yes  Do you have any questions about your discharge instructions: Diet   no Medications  no Follow up visit  no  Do you have questions or concerns about your Care? no  Actions: * If pain score is 4 or above: No action needed, pain <4.

## 2011-12-25 ENCOUNTER — Encounter: Payer: Self-pay | Admitting: Gastroenterology

## 2012-01-05 ENCOUNTER — Telehealth: Payer: Self-pay | Admitting: Gastroenterology

## 2012-01-05 NOTE — Telephone Encounter (Signed)
Pt states he received a bill in the mail for an EGD. Pt actually had a colon and the report is in the chart. Pt given the phone number [phone] to call with questions regarding his bill.

## 2012-05-06 ENCOUNTER — Other Ambulatory Visit: Payer: Self-pay | Admitting: Internal Medicine

## 2012-05-06 MED ORDER — IPRATROPIUM-ALBUTEROL 0.5-2.5 (3) MG/3ML IN SOLN: 3.0000 mL | RESPIRATORY_TRACT | Status: DC | PRN

## 2012-05-16 ENCOUNTER — Other Ambulatory Visit (HOSPITAL_COMMUNITY): Payer: Self-pay | Admitting: Internal Medicine

## 2012-05-16 ENCOUNTER — Ambulatory Visit (HOSPITAL_COMMUNITY)
Admission: RE | Admit: 2012-05-16 | Discharge: 2012-05-16 | Disposition: A | Discharge status: Home / Self Care | Payer: Medicare Other | Source: Ambulatory Visit | Attending: Internal Medicine | Admitting: Internal Medicine

## 2012-05-16 DIAGNOSIS — M545 Low back pain, unspecified: Secondary | ICD-10-CM | POA: Insufficient documentation

## 2012-05-16 DIAGNOSIS — J4489 Other specified chronic obstructive pulmonary disease: Secondary | ICD-10-CM | POA: Insufficient documentation

## 2012-05-16 DIAGNOSIS — M51379 Other intervertebral disc degeneration, lumbosacral region without mention of lumbar back pain or lower extremity pain: Secondary | ICD-10-CM | POA: Insufficient documentation

## 2012-05-16 DIAGNOSIS — J449 Chronic obstructive pulmonary disease, unspecified: Secondary | ICD-10-CM | POA: Insufficient documentation

## 2012-05-16 DIAGNOSIS — R05 Cough: Secondary | ICD-10-CM | POA: Insufficient documentation

## 2012-05-16 DIAGNOSIS — R0989 Other specified symptoms and signs involving the circulatory and respiratory systems: Secondary | ICD-10-CM | POA: Insufficient documentation

## 2012-05-16 DIAGNOSIS — R059 Cough, unspecified: Secondary | ICD-10-CM | POA: Insufficient documentation

## 2012-05-16 DIAGNOSIS — F17200 Nicotine dependence, unspecified, uncomplicated: Secondary | ICD-10-CM | POA: Insufficient documentation

## 2012-05-16 DIAGNOSIS — M5137 Other intervertebral disc degeneration, lumbosacral region: Secondary | ICD-10-CM | POA: Insufficient documentation

## 2012-07-05 ENCOUNTER — Encounter: Payer: Self-pay | Admitting: Internal Medicine

## 2012-07-05 ENCOUNTER — Ambulatory Visit (INDEPENDENT_AMBULATORY_CARE_PROVIDER_SITE_OTHER): Payer: Medicare Other | Admitting: Internal Medicine

## 2012-07-05 VITALS — BP 110/66 | HR 103 | Ht 70.0 in | Wt 154.6 lb

## 2012-07-05 DIAGNOSIS — F17200 Nicotine dependence, unspecified, uncomplicated: Secondary | ICD-10-CM

## 2012-07-05 DIAGNOSIS — J438 Other emphysema: Secondary | ICD-10-CM

## 2012-07-05 DIAGNOSIS — J439 Emphysema, unspecified: Secondary | ICD-10-CM

## 2012-07-05 MED ORDER — MOMETASONE FUROATE 50 MCG/ACT NA SUSP: 2.0000 | NASAL | Status: DC | PRN

## 2012-07-05 MED ORDER — BUDESONIDE-FORMOTEROL FUMARATE 160-4.5 MCG/ACT IN AERO: 2.0000 | INHALATION_SPRAY | Freq: Two times a day (BID) | RESPIRATORY_TRACT | Status: DC

## 2012-07-05 MED ORDER — IPRATROPIUM-ALBUTEROL 0.5-2.5 (3) MG/3ML IN SOLN: 3.0000 mL | RESPIRATORY_TRACT | Status: DC | PRN

## 2012-07-05 MED ORDER — THEOPHYLLINE ER 200 MG PO CP24: ORAL_CAPSULE | ORAL | Status: DC

## 2012-07-05 MED ORDER — ALBUTEROL SULFATE HFA 108 (90 BASE) MCG/ACT IN AERS: 2.0000 | INHALATION_SPRAY | Freq: Four times a day (QID) | RESPIRATORY_TRACT | Status: DC | PRN

## 2012-07-05 NOTE — Patient Instructions (Addendum)
Order- Schedule PFT and 6 MWT   Dx COPD with emphysema  Script- try theophylline  Script refills sent

## 2012-07-05 NOTE — Progress Notes (Signed)
Subjective:    Patient ID: [name], male    DOB: 07/09/1945, 67 y.o.   MRN: [id number]  HPI 06/23/10- 69 yo tobacco user 1/2 ppd, followed for COPD   here with dtr. Last here October 27, 2009. Since then had back surgery -did ok. For last 2-3 weeks had been tighter through chest, difficult to clear thick mucus without fever, green or sore throat. Just feels he can't clear mucus. Has had 2 steroid shots for this this in past month. Taking doxycycline and just finished prednisone. Neb with albuterol opens him only transiently. DOE but comfortable at rest. Denies chest pain. Has had a chest CT at [facility]. CT w/cm 04/01/10- emphysema, NAD, left apical scarring, compression fx's T8, T12 Nuclear bone scan 04/27/10- uptake c/w T8 vertebral compression fx.  07/06/11- 29 yo tobacco user 1/2 ppd, followed for COPD   here with dtr.  Patient states about the same as last visit. c/o  occasional sob and wheezing. Denies chest pain and chest tightness.  He is still smoking 3 packs per week, but considers this improved. We discussed cessation again.  He reports 3 episodes of thrush, using Symbicort. Discussed mouth care and aerochamber. DOE limits stairs and brisk walk. Cough productive clear white. Denies chest pain, blood. Sensitive to weather, cold, humidity.  07/05/12- 34 yo smoker, followed for COPD  FOLLOWS FOR: gives out quickly if trying to do too much; SOB as well. Has to pace himself. Reduced cigarettes to 4 or 5 a day and trying to do better. Breathing limits exercise tolerance. Wheeze, cough, clear sputum. Prescription for prednisone taper from Dr Oneta Rack he did not fill. CXR 05/16/12 IMPRESSION:  COPD with stable hyperinflation. No new focal or acute abnormality  identified  Original Report Authenticated By: Rhodia Albright, M.D. PFT 07/02/2009-severe emphysema-FEV1/FVC 0.33, DLCO 71%.  ROS-see HPI Constitutional:   No-   weight loss, night sweats, fevers, chills, fatigue, lassitude. HEENT:   No-   headaches, difficulty swallowing, tooth/dental problems, sore throat,       No-  sneezing, itching, ear ache, nasal congestion, post nasal drip,  CV:  No-   chest pain, orthopnea, PND, swelling in lower extremities, anasarca, dizziness, palpitations Resp: + shortness of breath with exertion or at rest.              + productive cough,  No non-productive cough,  No- coughing up of blood.              No-   change in color of mucus.  + wheezing.   Skin: No-   rash or lesions. GI:    GU:  MS:  No-   joint pain or swelling.   Neuro-     nothing unusual Psych:  No- change in mood or affect. No depression or anxiety.  No memory loss.  OBJ- Physical Exam General- Alert, Oriented, Affect-appropriate, Distress- none acute, slender Skin- rash-none, lesions- none, excoriation- none Lymphadenopathy- none Head- atraumatic            Eyes- Gross vision intact, PERRLA, conjunctivae and secretions clear            Ears- Hearing, canals-normal            Nose- clear, no-Septal dev, mucus, polyps, erosion, perforation             Throat- Mallampati II , mucosa clear , drainage- none, tonsils- atrophic Neck- flexible , trachea midline, no stridor , thyroid nl, carotid no bruit Chest - symmetrical excursion ,  unlabored           Heart/CV- RRR , no murmur , no gallop  , no rub, nl s1 s2                           - JVD- none , edema- none, stasis changes- none, varices- none           Lung- +distant, wheeze, cough- none , dullness-none, rub- none           Chest wall-  Abd-  Br/ Gen/ Rectal- Not done, not indicated Extrem- cyanosis- none, clubbing, none, atrophy- none, strength- nl Neuro- grossly intact to observation

## 2012-07-18 ENCOUNTER — Encounter: Payer: Self-pay | Admitting: Internal Medicine

## 2012-07-18 NOTE — Assessment & Plan Note (Signed)
We discussed ways to get off of his last few cigarettes.

## 2012-07-18 NOTE — Assessment & Plan Note (Signed)
There is a small potentially reversible component. We discussed trial of theophylline. Plan-theophylline trial, schedule PFT with 6 minute walk test

## 2012-07-23 ENCOUNTER — Ambulatory Visit (INDEPENDENT_AMBULATORY_CARE_PROVIDER_SITE_OTHER): Payer: Medicare Other | Admitting: Internal Medicine

## 2012-07-23 DIAGNOSIS — J439 Emphysema, unspecified: Secondary | ICD-10-CM

## 2012-07-23 DIAGNOSIS — J438 Other emphysema: Secondary | ICD-10-CM

## 2012-07-23 LAB — PULMONARY FUNCTION TEST

## 2012-07-23 NOTE — Progress Notes (Signed)
PFT done today. 

## 2012-08-05 NOTE — Progress Notes (Signed)
Documentation for 6 minute walk test 

## 2012-09-04 ENCOUNTER — Encounter: Payer: Self-pay | Admitting: Internal Medicine

## 2012-09-04 ENCOUNTER — Encounter: Payer: Self-pay | Admitting: Gastroenterology

## 2012-09-04 ENCOUNTER — Ambulatory Visit (INDEPENDENT_AMBULATORY_CARE_PROVIDER_SITE_OTHER): Payer: Medicare Other | Admitting: Internal Medicine

## 2012-09-04 VITALS — BP 130/80 | HR 93 | Ht 70.5 in | Wt 155.4 lb

## 2012-09-04 DIAGNOSIS — J439 Emphysema, unspecified: Secondary | ICD-10-CM

## 2012-09-04 DIAGNOSIS — J438 Other emphysema: Secondary | ICD-10-CM

## 2012-09-04 NOTE — Progress Notes (Signed)
Subjective:    Patient ID: [name], male    DOB: 1945/05/16, 67 y.o.   MRN: [id number]  HPI 06/23/10- 3 yo tobacco user 1/2 ppd, followed for COPD   here with dtr. Last here October 27, 2009. Since then had back surgery -did ok. For last 2-3 weeks had been tighter through chest, difficult to clear thick mucus without fever, green or sore throat. Just feels he can't clear mucus. Has had 2 steroid shots for this this in past month. Taking doxycycline and just finished prednisone. Neb with albuterol opens him only transiently. DOE but comfortable at rest. Denies chest pain. Has had a chest CT at [facility]. CT w/cm 04/01/10- emphysema, NAD, left apical scarring, compression fx's T8, T12 Nuclear bone scan 04/27/10- uptake c/w T8 vertebral compression fx.  07/06/11- 62 yo tobacco user 1/2 ppd, followed for COPD   here with dtr.  Patient states about the same as last visit. c/o  occasional sob and wheezing. Denies chest pain and chest tightness.  He is still smoking 3 packs per week, but considers this improved. We discussed cessation again.  He reports 3 episodes of thrush, using Symbicort. Discussed mouth care and aerochamber. DOE limits stairs and brisk walk. Cough productive clear white. Denies chest pain, blood. Sensitive to weather, cold, humidity.  07/05/12- 26 yo smoker, followed for COPD  FOLLOWS FOR: gives out quickly if trying to do too much; SOB as well. Has to pace himself. Reduced cigarettes to 4 or 5 a day and trying to do better. Breathing limits exercise tolerance. Wheeze, cough, clear sputum. Prescription for prednisone taper from Dr Oneta Rack he did not fill. CXR 05/16/12 IMPRESSION:  COPD with stable hyperinflation. No new focal or acute abnormality  identified  Original Report Authenticated By: Rhodia Albright, M.D. PFT 07/02/2009-severe emphysema-FEV1/FVC 0.33, DLCO 71%.  09/04/12- 38 yo smoker, followed for COPD  FOLLOWS FOR: pt denies any concerns at this time-- breathing is doing  well Feeling much better. Likes the theophylline but says it is a tier IV with his insurance. Felt claustrophobic doing PFT. 07/23/12- 97%, 94%, 98%, 342 m. No oxygen limitation on this test. PFT- 07/23/12- . Severe obstructive airways disease with insignificant response to bronchodilator. Diffusion severely reduced. FEC 1.75/37%, FEV1 0.64/18%, FEV1/FVC 0.36/48%, FEF 25-75% 38/48% predicted. DLCO 44%.  ROS-see HPI Constitutional:   No-   weight loss, night sweats, fevers, chills, fatigue, lassitude. HEENT:   No-  headaches, difficulty swallowing, tooth/dental problems, sore throat,       No-  sneezing, itching, ear ache, nasal congestion, post nasal drip,  CV:  No-   chest pain, orthopnea, PND, swelling in lower extremities, anasarca, dizziness, palpitations Resp: + shortness of breath with exertion or at rest.              + productive cough,  No non-productive cough,  No- coughing up of blood.              No-   change in color of mucus.  + wheezing.   Skin: No-   rash or lesions. GI:    GU:  MS:  No-   joint pain or swelling.   Neuro-     nothing unusual Psych:  No- change in mood or affect. No depression or anxiety.  No memory loss.  OBJ- Physical Exam General- Alert, Oriented, Affect-appropriate, Distress- none acute, slender Skin- rash-none, lesions- none, excoriation- none Lymphadenopathy- none Head- atraumatic  Eyes- Gross vision intact, PERRLA, conjunctivae and secretions clear            Ears- Hearing, canals-normal            Nose- clear, no-Septal dev, mucus, polyps, erosion, perforation             Throat- Mallampati II , mucosa clear , drainage- none, tonsils- atrophic Neck- flexible , trachea midline, no stridor , thyroid nl, carotid no bruit Chest - symmetrical excursion , unlabored           Heart/CV- RRR , no murmur , no gallop  , no rub, nl s1 s2                           - JVD- none , edema- none, stasis changes- none, varices- none           Lung-  +distant, +wheeze, cough- none , dullness-none, rub- none           Chest wall-  Abd-  Br/ Gen/ Rectal- Not done, not indicated Extrem- cyanosis- none, clubbing, none, atrophy- none, strength- nl Neuro- grossly intact to observation

## 2012-09-04 NOTE — Patient Instructions (Addendum)
Ask your pharmacist what they can get you as a generic theophylline. We can change your script based on that.

## 2012-09-22 NOTE — Assessment & Plan Note (Addendum)
Very severe emphysema with some bronchitis. He found theophylline helpful. We talked about side effects again. Plan-we will talk with his pharmacist about generic theophylline

## 2012-12-10 ENCOUNTER — Encounter: Payer: Self-pay | Admitting: Internal Medicine

## 2012-12-10 DIAGNOSIS — E559 Vitamin D deficiency, unspecified: Secondary | ICD-10-CM

## 2012-12-10 DIAGNOSIS — R030 Elevated blood-pressure reading, without diagnosis of hypertension: Secondary | ICD-10-CM

## 2012-12-10 DIAGNOSIS — IMO0002 Reserved for concepts with insufficient information to code with codable children: Secondary | ICD-10-CM

## 2012-12-10 DIAGNOSIS — E785 Hyperlipidemia, unspecified: Secondary | ICD-10-CM

## 2012-12-11 ENCOUNTER — Encounter: Payer: Self-pay | Admitting: Emergency Medicine

## 2012-12-11 ENCOUNTER — Ambulatory Visit: Payer: Self-pay | Admitting: Emergency Medicine

## 2012-12-11 VITALS — BP 132/70 | HR 90 | Temp 98.0°F | Resp 20 | Ht 70.0 in | Wt 153.0 lb

## 2012-12-11 DIAGNOSIS — Z0000 Encounter for general adult medical examination without abnormal findings: Secondary | ICD-10-CM

## 2012-12-11 DIAGNOSIS — R030 Elevated blood-pressure reading, without diagnosis of hypertension: Secondary | ICD-10-CM

## 2012-12-11 DIAGNOSIS — E559 Vitamin D deficiency, unspecified: Secondary | ICD-10-CM

## 2012-12-11 DIAGNOSIS — R5381 Other malaise: Secondary | ICD-10-CM

## 2012-12-11 DIAGNOSIS — R7309 Other abnormal glucose: Secondary | ICD-10-CM

## 2012-12-11 DIAGNOSIS — E782 Mixed hyperlipidemia: Secondary | ICD-10-CM

## 2012-12-11 LAB — LIPID PANEL
Cholesterol: 194 mg/dL (ref 0–200)
LDL Cholesterol: 122 mg/dL — ABNORMAL HIGH (ref 0–99)
Total CHOL/HDL Ratio: 3.4 Ratio
VLDL: 15 mg/dL (ref 0–40)

## 2012-12-11 LAB — HEPATIC FUNCTION PANEL
ALT: 14 U/L (ref 0–53)
AST: 21 U/L (ref 0–37)
Albumin: 4.5 g/dL (ref 3.5–5.2)
Alkaline Phosphatase: 84 U/L (ref 39–117)
Bilirubin, Direct: 0.1 mg/dL (ref 0.0–0.3)
Indirect Bilirubin: 0.5 mg/dL (ref 0.0–0.9)
Total Bilirubin: 0.6 mg/dL (ref 0.3–1.2)
Total Protein: 6.7 g/dL (ref 6.0–8.3)

## 2012-12-11 LAB — CBC WITH DIFFERENTIAL/PLATELET
Basophils Absolute: 0.1 10*3/uL (ref 0.0–0.1)
Basophils Relative: 1 % (ref 0–1)
Eosinophils Absolute: 0.4 10*3/uL (ref 0.0–0.7)
Eosinophils Relative: 5 % (ref 0–5)
Lymphs Abs: 1.8 10*3/uL (ref 0.7–4.0)
MCH: 31.7 pg (ref 26.0–34.0)
MCHC: 34 g/dL (ref 30.0–36.0)
MCV: 93.2 fL (ref 78.0–100.0)
Neutrophils Relative %: 56 % (ref 43–77)
Platelets: 232 10*3/uL (ref 150–400)
RBC: 4.54 MIL/uL (ref 4.22–5.81)
RDW: 13.5 % (ref 11.5–15.5)

## 2012-12-11 LAB — TSH: TSH: 1.053 u[IU]/mL (ref 0.350–4.500)

## 2012-12-11 LAB — BASIC METABOLIC PANEL WITH GFR
Calcium: 9.5 mg/dL (ref 8.4–10.5)
Creat: 1.33 mg/dL (ref 0.50–1.35)
GFR, Est African American: 64 mL/min
Glucose, Bld: 78 mg/dL (ref 70–99)
Sodium: 139 mEq/L (ref 135–145)

## 2012-12-11 LAB — HEMOGLOBIN A1C
Hgb A1c MFr Bld: 5.9 % — ABNORMAL HIGH
Mean Plasma Glucose: 123 mg/dL — ABNORMAL HIGH

## 2012-12-11 LAB — MAGNESIUM: Magnesium: 2.1 mg/dL (ref 1.5–2.5)

## 2012-12-11 NOTE — Patient Instructions (Signed)
Chronic Obstructive Pulmonary Disease °Chronic obstructive pulmonary disease (COPD) is a lung disease. The lungs become damaged. This makes it hard to get air in and out of your lungs. The damage to your lungs cannot be changed. There are things you can do to improve the lungs and make you feel better. °HOME CARE °· Take all medicines as told by your doctor. °· Use medicines that you breathe in (inhale) as told by your doctor. °· Avoid medicines or cough syrups that dry up your airway (antihistamines) and do not allow you to get rid of thick spit. °· If you smoke, stop. °· Avoid being around smoke, chemicals, and fumes that bother your breathing. °· Avoid people that have a catchy (contagious) sickness. °· Avoid going outside when it is very hot, cold, or humid. °· Use humidifiers in your home and near your bedside if it helps your breathing. °· Drink enough fluids to keep your pee (urine) clear or pale yellow. °· Eat healthy foods. Eat smaller meals more often and rest before meals. °· Ask your doctor if it is okay to take vitamins or pills with minerals (supplements). °· Exercise and stay active. °· Rest with activity. °· Get into a comfortable position when you have trouble breathing. °· Learn and use tips on how to relax. °· Learn and use tips on how to control your breathing as told by your doctor. Try: °· Breathing in through your nose for 1 second. Then, breath out (exhale) through your puckered (like a whistle) lips for 2 seconds. °· Putting one hand on your belly (abdomen). Breathe in slowly through your nose. Your hand on your belly should move out. Breathe out slowly through your puckered lips. Your hand on your belly should move in as you breathe out. °· Learn and use controlled coughing to clear thick spit from your lungs. °1. Lean your head slightly forward. °2. Breathe in deeply. °3. Try to hold your breath for 3 seconds. °4. Keep your mouth slightly open while coughing 2 times. °5. Spit any thick  spit out into a tissue. °6. Rest and do the steps again 1 or 2 times as needed. °· Get all shots (vaccines) a told by your doctor. °· Learn how to manage stress. °· Schedule and go to all follow-up doctor visits. °· Go to therapy that can help you improve your lungs (pulmonary rehabilitation) as told by your doctor. °· Use oxygen at home as told by your doctor. °GET HELP RIGHT AWAY IF:  °· You can feel your heart beating really fast. °· You have shortness of breath while resting. °· You have shortness of breath that stops you from being able to talk. °· You have shortness of breath that stops you from doing normal activities. °· You have chest pain lasting longer than 5 minutes. °· You start to shake uncontrollably (seizure). °· Your family or friends notice that you are flustered or confused. °· You cough up more thick spit than usual. °· There is a change in the color or thickness of the spit. °· Breathing is more difficult than usual. °· Your breathing is faster than usual. °· Your skin color is more blueish than usual. °· You are running out of the medicine you take for breathing. °· You are anxious, uneasy, fearful, or restless. °· You have a fever. °MAKE SURE YOU:  °· Understand these instructions. °· Will watch your condition. °· Will get help right away if you are not doing well or get worse. °  Document Released: 06/28/2007 Document Revised: 12/27/2011 Document Reviewed: 09/05/2012 °[employer]® Patient Information ©2014 [employer]. ° °

## 2012-12-12 LAB — URINALYSIS, ROUTINE W REFLEX MICROSCOPIC
Bilirubin Urine: NEGATIVE
Glucose, UA: NEGATIVE mg/dL
Hgb urine dipstick: NEGATIVE
Leukocytes, UA: NEGATIVE
Protein, ur: NEGATIVE mg/dL
Specific Gravity, Urine: 1.009 (ref 1.005–1.030)
pH: 6 (ref 5.0–8.0)

## 2012-12-12 LAB — VITAMIN D 25 HYDROXY (VIT D DEFICIENCY, FRACTURES): Vit D, 25-Hydroxy: 83 ng/mL (ref 30–89)

## 2012-12-12 LAB — INSULIN, FASTING: Insulin fasting, serum: 13 u[IU]/mL (ref 3–28)

## 2012-12-12 LAB — MICROALBUMIN / CREATININE URINE RATIO: Microalb, Ur: 0.5 mg/dL (ref 0.00–1.89)

## 2012-12-13 NOTE — Progress Notes (Addendum)
Subjective:    Patient ID: [name], male    DOB: [birth year], 67 y.o.   MRN: [id number]  HPI Comments: 67 yo WM presents for CPE and 3 month F/U for labile HTN, Cholesterol, Pre-Dm, D. Deficient. He notes his back has been stable with pain. He has been slowly trying to increase endurance with walking on treadmill and working a little around the house. He notes he has good and bad days with COPD and SOB. He notes recent evaluation with pulmonologist and starting Theophylline have helped decrease exacerbations significantly. He has been eating descent. He had F/U 10/14 which showed improvement with PSA will repeat in 1/15. He was seen at dermatology earlier this month with multiple removals all benign. He is due for repeat 1 year colon f/u for polyps, but has decided to place on hold until they resolve issue from last years exam. He denies any bowel symptoms. Overall he notes he is feeling better and has no concerns.   Hyperlipidemia Associated symptoms include shortness of breath.  Benign Prostatic Hypertrophy    Current Outpatient Prescriptions on File Prior to Visit  Medication Sig Dispense Refill  . albuterol (PROAIR HFA) 108 (90 BASE) MCG/ACT inhaler Inhale 2 puffs into the lungs every 6 (six) hours as needed for wheezing or shortness of breath.  1 Inhaler  11  . aspirin 81 MG tablet Take 81 mg by mouth daily.      . budesonide-formoterol (SYMBICORT) 160-4.5 MCG/ACT inhaler Inhale 2 puffs into the lungs 2 (two) times daily. rinse mouth  10.2 g  11  . calcium carbonate (OS-CAL) 600 MG TABS Take 600 mg by mouth daily.        . Cholecalciferol (VITAMIN D-3 PO) Take 5,000 Units by mouth daily.      Marland Kitchen dextromethorphan-guaiFENesin (MUCINEX DM) 30-600 MG per 12 hr tablet Take 1 tablet by mouth 2 (two) times daily.        Marland Kitchen HYDROcodone-acetaminophen (VICODIN) 5-500 MG per tablet Take 1 tablet by mouth as needed.      Marland Kitchen ibuprofen (ADVIL,MOTRIN) 200 MG tablet Take 200 mg by mouth every 6 (six) hours  as needed.      Marland Kitchen ipratropium-albuterol (DUONEB) 0.5-2.5 (3) MG/3ML SOLN Take 3 mLs by nebulization every 4 (four) hours as needed. DX COPD 496  225 mL  prn  . mometasone (NASONEX) 50 MCG/ACT nasal spray Place 2 sprays into the nose daily.      . theophylline (THEO-24) 200 MG 24 hr capsule 1 twice daily with meals  60 capsule  prn  . vitamin B-12 (CYANOCOBALAMIN) 100 MCG tablet Take 100 mcg by mouth daily.       No current facility-administered medications on file prior to visit.    Review of patient's allergies indicates no known allergies.  Past Medical History  Diagnosis Date  . Epistaxis age 39    after blunt facial trauma. needed surgical clamps and 40 units of blood  . COPD (chronic obstructive pulmonary disease)   . Hyperlipidemia   . DDD (degenerative disc disease)   . Unspecified vitamin D deficiency   . Elevated blood pressure reading without diagnosis of hypertension   . DJD (degenerative joint disease)     Past Surgical History  Procedure Laterality Date  . Bilateral knee arthroscopy    . Surgical hemostasis      in sinuses after [place]  . Prostate biopsy    . Back surgery  04-2010    disc replacement   History  Substance Use Topics  . Smoking status: Current Every Day Smoker -- 0.25 packs/day for 48 years    Types: Cigarettes  . Smokeless tobacco: Never Used  . Alcohol Use: No   Family History  Problem Relation Age of Onset  . Stroke Father   . Hyperlipidemia Father   . Heart disease Father 68    deceased with cerebral aneurysm  . Heart disease Mother   . Lung disease Mother   . Asthma Mother   . Hypertension Mother   . Hyperlipidemia Mother   . Colon cancer Neg Hx   . Diabetes Sister      Review of Systems  Eyes:       [name] Due 12/15  Respiratory: Positive for shortness of breath.        [name] Eval 7/14-Occasionally SOB  Genitourinary:       [name]- 1/15  Musculoskeletal: Positive for back pain.       [name]- Chronic pain lumbar   Skin:       [name]- Yearly wnl  All other systems reviewed and are negative.    BP 132/70  Pulse 90  Temp(Src) 98 F (36.7 C) (Temporal)  Resp 20  Ht 5\' 10"  (1.778 m)  Wt 153 lb (69.4 kg)  BMI 21.95 kg/m2     Objective:   Physical Exam  Nursing note and vitals reviewed. Constitutional: He is oriented to person, place, and time. He appears well-developed and well-nourished.  HENT:  Head: Normocephalic and atraumatic.  Right Ear: External ear normal.  Left Ear: External ear normal.  Nose: Nose normal.  Mouth/Throat: Oropharynx is clear and moist.  Eyes: Conjunctivae and EOM are normal. Pupils are equal, round, and reactive to light. No scleral icterus.  Neck: Normal range of motion. Neck supple. No JVD present. No tracheal deviation present. No thyromegaly present.  Cardiovascular: Normal rate, regular rhythm, normal heart sounds and intact distal pulses.  Exam reveals no gallop and no friction rub.   No murmur heard. Pulmonary/Chest: Effort normal. No respiratory distress. He has no wheezes. He has no rales. He exhibits no tenderness.  Distant BS  Abdominal: Soft. Bowel sounds are normal. He exhibits no distension and no mass. There is no tenderness. There is no rebound and no guarding.  Genitourinary:  Def to urology  Musculoskeletal: Normal range of motion. He exhibits no tenderness.  Slow to change positions with back discomfort  Lymphadenopathy:    He has no cervical adenopathy.  Neurological: He is alert and oriented to person, place, and time. He has normal reflexes. No cranial nerve deficit. He exhibits normal muscle tone. Coordination normal.  Skin: Skin is warm and dry.  thick great toe nails with mild discoloration  Psychiatric: He has a normal mood and affect. His behavior is normal. Judgment and thought content normal.          Assessment & Plan:  1. CPE and 3 month F/U for labile HTN, Cholesterol, Pre-Dm, D. Deficient, check labs, continue to increase  exercise, improve diet and check BP weekly. W/C if stays above 130/80 2. COPD aware of exacerbation triggers. SX Symbicort #5 given use AD. 3. Thick toe nails, trauma vs fungus- Will try epsom salt soaks and superglue coverage

## 2013-03-06 ENCOUNTER — Ambulatory Visit (INDEPENDENT_AMBULATORY_CARE_PROVIDER_SITE_OTHER)
Admission: RE | Admit: 2013-03-06 | Discharge: 2013-03-06 | Disposition: A | Discharge status: Home / Self Care | Payer: Medicare HMO | Source: Ambulatory Visit | Attending: Internal Medicine | Admitting: Internal Medicine

## 2013-03-06 ENCOUNTER — Encounter: Payer: Self-pay | Admitting: Internal Medicine

## 2013-03-06 ENCOUNTER — Ambulatory Visit (INDEPENDENT_AMBULATORY_CARE_PROVIDER_SITE_OTHER): Payer: Medicare HMO | Admitting: Internal Medicine

## 2013-03-06 VITALS — BP 120/70 | HR 101 | Ht 70.5 in | Wt 155.4 lb

## 2013-03-06 DIAGNOSIS — J439 Emphysema, unspecified: Secondary | ICD-10-CM

## 2013-03-06 DIAGNOSIS — J438 Other emphysema: Secondary | ICD-10-CM

## 2013-03-06 DIAGNOSIS — F17200 Nicotine dependence, unspecified, uncomplicated: Secondary | ICD-10-CM

## 2013-03-06 DIAGNOSIS — J449 Chronic obstructive pulmonary disease, unspecified: Secondary | ICD-10-CM

## 2013-03-06 MED ORDER — BUDESONIDE-FORMOTEROL FUMARATE 160-4.5 MCG/ACT IN AERO: 2.0000 | INHALATION_SPRAY | Freq: Two times a day (BID) | RESPIRATORY_TRACT | Status: DC

## 2013-03-06 MED ORDER — ALBUTEROL SULFATE HFA 108 (90 BASE) MCG/ACT IN AERS: 2.0000 | INHALATION_SPRAY | Freq: Four times a day (QID) | RESPIRATORY_TRACT | Status: DC | PRN

## 2013-03-06 MED ORDER — IPRATROPIUM-ALBUTEROL 0.5-2.5 (3) MG/3ML IN SOLN: 3.0000 mL | RESPIRATORY_TRACT | Status: DC | PRN

## 2013-03-06 NOTE — Patient Instructions (Addendum)
Printed refill scripts for albuterol HFA rescue inhaler, nebulizer medicine and Symbicort maintenance inhaler  Order CXR   Dx COPD

## 2013-03-06 NOTE — Progress Notes (Signed)
Subjective:    Patient ID: Chief Walkup, male    DOB: 1945-04-12, 68 y.o.   MRN: [id number]  HPI 06/23/10- 71 yo tobacco user 1/2 ppd, followed for COPD   here with dtr. Last here October 27, 2009. Since then had back surgery -did ok. For last 2-3 weeks had been tighter through chest, difficult to clear thick mucus without fever, green or sore throat. Just feels he can't clear mucus. Has had 2 steroid shots for this this in past month. Taking doxycycline and just finished prednisone. Neb with albuterol opens him only transiently. DOE but comfortable at rest. Denies chest pain. Has had a chest CT at [facility]. CT w/cm 04/01/10- emphysema, NAD, left apical scarring, compression fx's T8, T12 Nuclear bone scan 04/27/10- uptake c/w T8 vertebral compression fx.  07/06/11- 66 yo tobacco user 1/2 ppd, followed for COPD   here with dtr.  Patient states about the same as last visit. c/o  occasional sob and wheezing. Denies chest pain and chest tightness.  He is still smoking 3 packs per week, but considers this improved. We discussed cessation again.  He reports 3 episodes of thrush, using Symbicort. Discussed mouth care and aerochamber. DOE limits stairs and brisk walk. Cough productive clear white. Denies chest pain, blood. Sensitive to weather, cold, humidity.  07/05/12- 68 yo smoker, followed for COPD  FOLLOWS FOR: gives out quickly if trying to do too much; SOB as well. Has to pace himself. Reduced cigarettes to 4 or 5 a day and trying to do better. Breathing limits exercise tolerance. Wheeze, cough, clear sputum. Prescription for prednisone taper from Dr Melford Aase he did not fill. CXR 05/16/12 IMPRESSION:  COPD with stable hyperinflation. No new focal or acute abnormality  identified  Original Report Authenticated By: Ponciano Ort, M.D. PFT 07/02/2009-severe emphysema-FEV1/FVC 0.33, DLCO 71%.  09/04/12- 55 yo smoker, followed for COPD  FOLLOWS FOR: pt denies any concerns at this time-- breathing is doing  well Feeling much better. Likes the theophylline but says it is a tier IV with his insurance. Felt claustrophobic doing PFT. 6MWT 07/23/12- 97%, 94%, 98%, 342 m. No oxygen limitation on this test. PFT- 07/23/12- . Severe obstructive airways disease with insignificant response to bronchodilator. Diffusion severely reduced. FEC 1.75/37%, FEV1 0.64/18%, FEV1/FVC 0.36/48%, FEF 25-75% 38/48% predicted. DLCO 44%.  03/06/13- 12 yo smoker, followed for COPD  FOLLOWS FOR:  Breathing doing well- no concerns today Dislikes the cold weather but no acute problem  ROS-see HPI Constitutional:   No-   weight loss, night sweats, fevers, chills, fatigue, lassitude. HEENT:   No-  headaches, difficulty swallowing, tooth/dental problems, sore throat,       No-  sneezing, itching, ear ache, nasal congestion, post nasal drip,  CV:  No-   chest pain, orthopnea, PND, swelling in lower extremities, anasarca, dizziness, palpitations Resp: + shortness of breath with exertion or at rest.              + productive cough,  No non-productive cough,  No- coughing up of blood.              No-   change in color of mucus.  + wheezing.   Skin: No-   rash or lesions. GI:    GU:  MS:  No-   joint pain or swelling.   Neuro-     nothing unusual Psych:  No- change in mood or affect. No depression or anxiety.  No memory loss.  OBJ- Physical Exam General- Alert, Oriented,  Affect-appropriate, Distress- none acute, slender Skin- rash-none, lesions- none, excoriation- none Lymphadenopathy- none Head- atraumatic            Eyes- Gross vision intact, PERRLA, conjunctivae and secretions clear            Ears- Hearing, canals-normal            Nose- clear, no-Septal dev, mucus, polyps, erosion, perforation             Throat- Mallampati II , mucosa clear , drainage- none, tonsils- atrophic Neck- flexible , trachea midline, no stridor , thyroid nl, carotid no bruit Chest - symmetrical excursion , unlabored           Heart/CV- RRR , no  murmur , no gallop  , no rub, nl s1 s2                           - JVD- none , edema- none, stasis changes- none, varices- none           Lung- +distant, +wheeze-expiratory RUL, cough- none , dullness-none, rub- none           Chest wall-  Abd-  Br/ Gen/ Rectal- Not done, not indicated Extrem- cyanosis- none, clubbing, none, atrophy- none, strength- nl Neuro- grossly intact to observation

## 2013-03-25 ENCOUNTER — Other Ambulatory Visit: Payer: Self-pay | Admitting: *Deleted

## 2013-03-25 MED ORDER — BUDESONIDE-FORMOTEROL FUMARATE 160-4.5 MCG/ACT IN AERO: 2.0000 | INHALATION_SPRAY | Freq: Two times a day (BID) | RESPIRATORY_TRACT | Status: DC

## 2013-03-30 NOTE — Assessment & Plan Note (Signed)
Tobacco counseling done

## 2013-03-30 NOTE — Assessment & Plan Note (Signed)
Severe COPD, controlled Plan chest x-ray, medication refills

## 2013-03-31 ENCOUNTER — Encounter: Payer: Self-pay | Admitting: Internal Medicine

## 2013-03-31 DIAGNOSIS — Z79899 Other long term (current) drug therapy: Secondary | ICD-10-CM | POA: Insufficient documentation

## 2013-03-31 DIAGNOSIS — R7303 Prediabetes: Secondary | ICD-10-CM | POA: Insufficient documentation

## 2013-03-31 NOTE — Patient Instructions (Addendum)
Error

## 2013-03-31 NOTE — Progress Notes (Signed)
Error

## 2013-04-07 ENCOUNTER — Ambulatory Visit: Payer: Self-pay | Admitting: Internal Medicine

## 2013-04-08 ENCOUNTER — Encounter: Payer: Self-pay | Admitting: Internal Medicine

## 2013-04-08 ENCOUNTER — Ambulatory Visit (INDEPENDENT_AMBULATORY_CARE_PROVIDER_SITE_OTHER): Payer: Medicare HMO | Admitting: Internal Medicine

## 2013-04-08 VITALS — BP 130/76 | HR 76 | Temp 99.1°F | Resp 20 | Ht 70.0 in | Wt 151.4 lb

## 2013-04-08 DIAGNOSIS — R030 Elevated blood-pressure reading, without diagnosis of hypertension: Secondary | ICD-10-CM

## 2013-04-08 DIAGNOSIS — E785 Hyperlipidemia, unspecified: Secondary | ICD-10-CM

## 2013-04-08 DIAGNOSIS — E559 Vitamin D deficiency, unspecified: Secondary | ICD-10-CM

## 2013-04-08 DIAGNOSIS — Z79899 Other long term (current) drug therapy: Secondary | ICD-10-CM

## 2013-04-08 DIAGNOSIS — R7309 Other abnormal glucose: Secondary | ICD-10-CM

## 2013-04-08 LAB — CBC WITH DIFFERENTIAL/PLATELET
BASOS ABS: 0.1 10*3/uL (ref 0.0–0.1)
BASOS PCT: 1 % (ref 0–1)
EOS ABS: 0.2 10*3/uL (ref 0.0–0.7)
Eosinophils Relative: 3 % (ref 0–5)
HCT: 40.7 % (ref 39.0–52.0)
Hemoglobin: 14 g/dL (ref 13.0–17.0)
LYMPHS ABS: 1.4 10*3/uL (ref 0.7–4.0)
Lymphocytes Relative: 21 % (ref 12–46)
MCH: 32.2 pg (ref 26.0–34.0)
MCHC: 34.4 g/dL (ref 30.0–36.0)
MCV: 93.6 fL (ref 78.0–100.0)
Monocytes Absolute: 0.7 10*3/uL (ref 0.1–1.0)
Monocytes Relative: 10 % (ref 3–12)
NEUTROS PCT: 65 % (ref 43–77)
Neutro Abs: 4.3 10*3/uL (ref 1.7–7.7)
PLATELETS: 177 10*3/uL (ref 150–400)
RBC: 4.35 MIL/uL (ref 4.22–5.81)
RDW: 13.7 % (ref 11.5–15.5)
WBC: 6.6 10*3/uL (ref 4.0–10.5)

## 2013-04-08 LAB — HEPATIC FUNCTION PANEL
ALT: 11 U/L (ref 0–53)
AST: 18 U/L (ref 0–37)
Albumin: 4.1 g/dL (ref 3.5–5.2)
Alkaline Phosphatase: 70 U/L (ref 39–117)
BILIRUBIN DIRECT: 0.2 mg/dL (ref 0.0–0.3)
BILIRUBIN TOTAL: 1 mg/dL (ref 0.2–1.2)
Indirect Bilirubin: 0.8 mg/dL (ref 0.2–1.2)
Total Protein: 6.4 g/dL (ref 6.0–8.3)

## 2013-04-08 LAB — BASIC METABOLIC PANEL WITH GFR
BUN: 16 mg/dL (ref 6–23)
CALCIUM: 9.1 mg/dL (ref 8.4–10.5)
CO2: 29 mEq/L (ref 19–32)
Chloride: 103 mEq/L (ref 96–112)
Creat: 1.35 mg/dL (ref 0.50–1.35)
GFR, EST AFRICAN AMERICAN: 63 mL/min
GFR, EST NON AFRICAN AMERICAN: 55 mL/min — AB
Glucose, Bld: 140 mg/dL — ABNORMAL HIGH (ref 70–99)
Potassium: 4.1 mEq/L (ref 3.5–5.3)
SODIUM: 141 meq/L (ref 135–145)

## 2013-04-08 LAB — HEMOGLOBIN A1C
HEMOGLOBIN A1C: 5.8 % — AB (ref <5.7)
MEAN PLASMA GLUCOSE: 120 mg/dL — AB (ref <117)

## 2013-04-08 LAB — LIPID PANEL
CHOL/HDL RATIO: 2.9 ratio
Cholesterol: 162 mg/dL (ref 0–200)
HDL: 56 mg/dL (ref >39)
LDL Cholesterol: 91 mg/dL (ref 0–99)
TRIGLYCERIDES: 75 mg/dL (ref <150)
VLDL: 15 mg/dL (ref 0–40)

## 2013-04-08 LAB — MAGNESIUM: MAGNESIUM: 2.1 mg/dL (ref 1.5–2.5)

## 2013-04-08 LAB — TSH: TSH: 0.886 u[IU]/mL (ref 0.350–4.500)

## 2013-04-08 NOTE — Patient Instructions (Signed)

## 2013-04-08 NOTE — Progress Notes (Signed)
Patient ID: [name], male   DOB: 1945-07-26, 68 y.o.   MRN: [id number]    This very nice 68 y.o. MWM presents for 3 month follow up with Hypertension, Hyperlipidemia, COPD, Pre-Diabetes and Vitamin D Deficiency.    Patient has Hx/o labile HTN which has been monitored expectently. Today's BP: 130/76 mmHg . Patient denies any cardiac type chest pain, palpitations, dyspnea/orthopnea/PND, dizziness, claudication, or dependent edema.   Hyperlipidemia is not controlled with diet as below.  Patient denies myalgias or other med SE's.  Lab Results  Component Value Date   CHOL 194 12/11/2012   HDL 57 12/11/2012   LDLCALC 122* 12/11/2012   TRIG 77 12/11/2012   CHOLHDL 3.4 12/11/2012    Also, the patient has history of PreDiabetes with A1c 5.7% in Nov 2013 and last A1c 5.9% in Nov 2014. Patient denies any symptoms of reactive hypoglycemia, diabetic polys, paresthesias or visual blurring.   Further, Patient has history of Vitamin D Deficiency with last vitamin D of 83 in Nov 2014. Patient supplements vitamin D without any suspected side-effects.  Medication Sig  . albuterol (PROAIR HFA) 108 (90 BASE) MCG/ACT inhaler Inhale 2 puffs into the lungs every 6 (six) hours as needed for wheezing or shortness of breath.  Marland Kitchen aspirin 81 MG tablet Take 81 mg by mouth daily.  . budesonide-formoterol (SYMBICORT) 160-4.5 MCG/ACT inhaler Inhale 2 puffs into the lungs 2 (two) times daily. rinse mouth  . calcium carbonate (OS-CAL) 600 MG TABS Take 600 mg by mouth daily.    . Cholecalciferol (VITAMIN D-3 PO) Take 5,000 Units by mouth daily.  Marland Kitchen dextromethorphan-guaiFENesin (MUCINEX DM) 30-600 MG per 12 hr tablet Take 1 tablet by mouth 2 (two) times daily.    Marland Kitchen ibuprofen (ADVIL,MOTRIN) 200 MG tablet Take 200 mg by mouth every 6 (six) hours as needed.  Marland Kitchen ipratropium-albuterol (DUONEB) 0.5-2.5 (3) MG/3ML SOLN Take 3 mLs by nebulization every 4 (four) hours as needed. DX COPD 496     No Known Allergies  PMHx:   Past  Medical History  Diagnosis Date  . Epistaxis age 68    after blunt facial trauma. needed surgical clamps and 40 units of blood  . COPD (chronic obstructive pulmonary disease)   . Hyperlipidemia   . DDD (degenerative disc disease)   . Unspecified vitamin D deficiency   . Elevated blood pressure reading without diagnosis of hypertension   . DJD (degenerative joint disease)     FHx:    Reviewed / unchanged  SHx:    Reviewed / unchanged   Systems Review: Constitutional: Denies fever, chills, wt changes, headaches, insomnia, fatigue, night sweats, change in appetite. Eyes: Denies redness, blurred vision, diplopia, discharge, itchy, watery eyes.  ENT: Denies discharge, congestion, post nasal drip, epistaxis, sore throat, earache, hearing loss, dental pain, tinnitus, vertigo, sinus pain, snoring.  CV: Denies chest pain, palpitations, irregular heartbeat, syncope, dyspnea, diaphoresis, orthopnea, PND, claudication, edema. Respiratory: denies cough, dyspnea, DOE, pleurisy, hoarseness, laryngitis, wheezing.  Gastrointestinal: Denies dysphagia, odynophagia, heartburn, reflux, water brash, abdominal pain or cramps, nausea, vomiting, bloating, diarrhea, constipation, hematemesis, melena, hematochezia,  or hemorrhoids. Genitourinary: Denies dysuria, frequency, urgency, nocturia, hesitancy, discharge, hematuria, flank pain. Musculoskeletal: Denies arthralgias, myalgias, stiffness, jt. swelling, pain, limp, strain/sprain.  Skin: Denies pruritus, rash, hives, warts, acne, eczema, change in skin lesion(s). Neuro: No weakness, tremor, incoordination, spasms, paresthesia, or pain. Psychiatric: Denies confusion, memory loss, or sensory loss. Endo: Denies change in weight, skin, hair change.  Heme/Lymph: No excessive bleeding, bruising, orenlarged lymph  nodes.  Exam:  BP 130/76  Pulse 76  Temp(Src) 99.1 F (37.3 C) (Temporal)  Resp 20  Ht 5\' 10"  (1.778 m)  Wt 151 lb 6.4 oz (68.675 kg)  BMI 21.72  kg/m2  Appears well nourished - in no distress. Eyes: PERRLA, EOMs, conjunctiva no swelling or erythema. Sinuses: No frontal/maxillary tenderness ENT/Mouth: EAC's clear, TM's nl w/o erythema, bulging. Nares clear w/o erythema, swelling, exudates. Oropharynx clear without erythema or exudates. Oral hygiene is good. Tongue normal, non obstructing. Hearing intact.  Neck: Supple. Thyroid nl. Car 2+/2+ without bruits, nodes or JVD. Chest: Respirations nl with BS clear & equal w/o rales, rhonchi, wheezing or stridor.  Cor: Heart sounds normal w/ regular rate and rhythm without sig. murmurs, gallops, clicks, or rubs. Peripheral pulses normal and equal  without edema.  Abdomen: Soft & bowel sounds normal. Non-tender w/o guarding, rebound, hernias, masses, or organomegaly.  Lymphatics: Unremarkable.  Musculoskeletal: Full ROM all peripheral extremities, joint stability, 5/5 strength, and normal gait.  Skin: Warm, dry without exposed rashes, lesions, ecchymosis apparent.  Neuro: Cranial nerves intact, reflexes equal bilaterally. Sensory-motor testing grossly intact. Tendon reflexes grossly intact.  Pysch: Alert & oriented x 3. Insight and judgement nl & appropriate. No ideations.  Assessment and Plan:  1. Hypertension - Continue monitor blood pressure at home. Continue diet/meds same.  2. Hyperlipidemia - Continue diet, exercise,& lifestyle modifications. Continue monitor periodic cholesterol/liver & renal functions. Pending labs , may need to start meds for cholesterol  3. Pre-diabetes/Insulin Resistance - Continue diet, exercise, lifestyle modifications. Monitor appropriate labs.  4. Vitamin D Deficiency - Continue supplementation.  Recommended regular exercise, BP monitoring, weight control, and discussed med and SE's. Recommended labs to assess and monitor clinical status. Further disposition pending results of labs.

## 2013-04-09 LAB — INSULIN, FASTING: INSULIN FASTING, SERUM: 107 u[IU]/mL — AB (ref 3–28)

## 2013-04-09 LAB — VITAMIN D 25 HYDROXY (VIT D DEFICIENCY, FRACTURES): VIT D 25 HYDROXY: 49 ng/mL (ref 30–89)

## 2013-04-13 ENCOUNTER — Encounter: Payer: Self-pay | Admitting: Internal Medicine

## 2013-04-13 DIAGNOSIS — N183 Chronic kidney disease, stage 3 unspecified: Secondary | ICD-10-CM | POA: Insufficient documentation

## 2013-05-15 ENCOUNTER — Encounter: Payer: Self-pay | Admitting: Gastroenterology

## 2013-06-05 ENCOUNTER — Other Ambulatory Visit: Payer: Self-pay | Admitting: *Deleted

## 2013-06-05 MED ORDER — BUDESONIDE-FORMOTEROL FUMARATE 160-4.5 MCG/ACT IN AERO: 2.0000 | INHALATION_SPRAY | Freq: Two times a day (BID) | RESPIRATORY_TRACT | Status: DC

## 2013-06-30 ENCOUNTER — Ambulatory Visit (INDEPENDENT_AMBULATORY_CARE_PROVIDER_SITE_OTHER): Payer: Commercial Managed Care - HMO | Admitting: Emergency Medicine

## 2013-06-30 ENCOUNTER — Ambulatory Visit
Admission: RE | Admit: 2013-06-30 | Discharge: 2013-06-30 | Disposition: A | Discharge status: Home / Self Care | Payer: Commercial Managed Care - HMO | Source: Ambulatory Visit | Attending: Emergency Medicine | Admitting: Emergency Medicine

## 2013-06-30 ENCOUNTER — Encounter: Payer: Self-pay | Admitting: Emergency Medicine

## 2013-06-30 VITALS — BP 150/86 | HR 94 | Temp 98.6°F | Resp 18 | Ht 70.0 in | Wt 150.0 lb

## 2013-06-30 DIAGNOSIS — R7309 Other abnormal glucose: Secondary | ICD-10-CM

## 2013-06-30 DIAGNOSIS — R030 Elevated blood-pressure reading, without diagnosis of hypertension: Secondary | ICD-10-CM

## 2013-06-30 DIAGNOSIS — J441 Chronic obstructive pulmonary disease with (acute) exacerbation: Secondary | ICD-10-CM

## 2013-06-30 DIAGNOSIS — J309 Allergic rhinitis, unspecified: Secondary | ICD-10-CM

## 2013-06-30 DIAGNOSIS — E782 Mixed hyperlipidemia: Secondary | ICD-10-CM

## 2013-06-30 LAB — CBC WITH DIFFERENTIAL/PLATELET
BASOS ABS: 0.1 10*3/uL (ref 0.0–0.1)
Basophils Relative: 1 % (ref 0–1)
EOS PCT: 4 % (ref 0–5)
Eosinophils Absolute: 0.3 10*3/uL (ref 0.0–0.7)
HEMATOCRIT: 42.4 % (ref 39.0–52.0)
HEMOGLOBIN: 14.2 g/dL (ref 13.0–17.0)
LYMPHS PCT: 19 % (ref 12–46)
Lymphs Abs: 1.3 10*3/uL (ref 0.7–4.0)
MCH: 31.3 pg (ref 26.0–34.0)
MCHC: 33.5 g/dL (ref 30.0–36.0)
MCV: 93.6 fL (ref 78.0–100.0)
MONO ABS: 1 10*3/uL (ref 0.1–1.0)
Monocytes Relative: 15 % — ABNORMAL HIGH (ref 3–12)
Neutro Abs: 4.1 10*3/uL (ref 1.7–7.7)
Neutrophils Relative %: 61 % (ref 43–77)
Platelets: 196 10*3/uL (ref 150–400)
RBC: 4.53 MIL/uL (ref 4.22–5.81)
RDW: 13 % (ref 11.5–15.5)
WBC: 6.8 10*3/uL (ref 4.0–10.5)

## 2013-06-30 LAB — HEPATIC FUNCTION PANEL
ALBUMIN: 4.4 g/dL (ref 3.5–5.2)
ALT: 13 U/L (ref 0–53)
AST: 17 U/L (ref 0–37)
Alkaline Phosphatase: 73 U/L (ref 39–117)
BILIRUBIN TOTAL: 0.8 mg/dL (ref 0.2–1.2)
Bilirubin, Direct: 0.1 mg/dL (ref 0.0–0.3)
Indirect Bilirubin: 0.7 mg/dL (ref 0.2–1.2)
Total Protein: 7 g/dL (ref 6.0–8.3)

## 2013-06-30 LAB — BASIC METABOLIC PANEL WITH GFR
BUN: 12 mg/dL (ref 6–23)
CALCIUM: 9.2 mg/dL (ref 8.4–10.5)
CO2: 29 meq/L (ref 19–32)
Chloride: 103 mEq/L (ref 96–112)
Creat: 1.25 mg/dL (ref 0.50–1.35)
GFR, Est African American: 69 mL/min
GFR, Est Non African American: 60 mL/min
GLUCOSE: 83 mg/dL (ref 70–99)
Potassium: 4.7 mEq/L (ref 3.5–5.3)
Sodium: 140 mEq/L (ref 135–145)

## 2013-06-30 LAB — LIPID PANEL
CHOLESTEROL: 171 mg/dL (ref 0–200)
HDL: 62 mg/dL (ref >39)
LDL Cholesterol: 93 mg/dL (ref 0–99)
Total CHOL/HDL Ratio: 2.8 Ratio
Triglycerides: 78 mg/dL (ref <150)
VLDL: 16 mg/dL (ref 0–40)

## 2013-06-30 LAB — HEMOGLOBIN A1C
Hgb A1c MFr Bld: 5.7 % — ABNORMAL HIGH (ref <5.7)
Mean Plasma Glucose: 117 mg/dL — ABNORMAL HIGH (ref <117)

## 2013-06-30 MED ORDER — DEXAMETHASONE SODIUM PHOSPHATE 10 MG/ML IJ SOLN
10.0000 mg | Freq: Once | INTRAMUSCULAR | Status: AC
  Administered 2013-06-30: 10 mg via INTRAMUSCULAR

## 2013-06-30 NOTE — Patient Instructions (Signed)
Chronic Obstructive Pulmonary Disease Chronic obstructive pulmonary disease (COPD) is a common lung problem. In COPD, the flow of air from the lungs is limited. The way your lungs work will probably never return to normal, but there are things you can do to improve you lungs and make yourself feel better. HOME CARE  Take all medicines as told by your doctor.  Only take over-the-counter or prescription medicines as told by your doctor.  Avoid medicines or cough syrups that dry up your airway (such as antihistamines) and do not allow you to get rid of thick spit. You do not need to avoid them if told differently by your doctor.  If you smoke, stop. Smoking makes the problem worse.  Avoid being around things that make your breathing worse (like smoke, chemicals, and fumes).  Use oxygen therapy and therapy to help improve your lungs (pulmonary rehabilitation) if told by your doctor. If you need home oxygen therapy, ask your doctor if you should buy a tool to measure your oxygen level (oximeter).  Avoid people who have a sickness you can catch (contagious).  Avoid going outside when it is very hot, cold, or humid.  Eat healthy foods. Eat smaller meals more often. Rest before meals.  Stay active, but remember to also rest.  Make sure to get all the shots (vaccines) your doctor recommends. Ask your doctor if you need a pneumonia shot.  Learn and use tips on how to relax.  Learn and use tips on how to control your breathing as told by your doctor. Try:  Breathing in (inhaling) through your nose for 1 second. Then, pucker your lips and breath out (exhale) through your lips for 2 seconds.  Putting one hand on your belly (abdomen). Breathe in slowly through your nose for 1 second. Your hand on your belly should move out. Pucker your lips and breathe out slowly through your lips. Your hand on your belly should move in as you breathe out.  Learn and use controlled coughing to clear thick spit  from your lungs. 1. Lean your head a little forward. 2. Breathe in deeply. 3. Try to hold your breath for 3 seconds. 4. Keep your mouth slightly open while coughing 2 times. 5. Spit any thick spit out into a tissue. 6. Rest and do the steps again 1 or 2 times as needed. GET HELP IF:  You cough up more thick spit than usual.  There is a change in the color or thickness of the spit.  It is harder to breathe than usual.  Your breathing is faster than usual. GET HELP RIGHT AWAY IF:   You have shortness of breath while resting.  You have shortness of breath that stops you from:  Being able to talk.  Doing normal activities.  You chest hurts for longer than 5 minutes.  Your skin color is more blue than usual.  Your pulse oximeter shows that you have low oxygen for longer than 5 minutes. MAKE SURE YOU:   Understand these instructions.  Will watch your condition.  Will get help right away if you are not doing well or get worse. Document Released: 06/28/2007 Document Revised: 10/30/2012 Document Reviewed: 09/05/2012 [employer] Patient Information 2014 [employer].  

## 2013-06-30 NOTE — Progress Notes (Signed)
Subjective:    Patient ID: [name], male    DOB: [birth year], 68 y.o.   MRN: [id number]  HPI Comments: 68 yo presents for 3 month F/U for HTN, Cholesterol, Pre-Dm, D. Deficient. He is eating healthy. He has not been as active with recent difficulty with breathing. He has not been checking BP and is not RX for HTN.  He has pulmnology f/u in [place]. He has had difficulty breathing over last 3 weeks but notes mild improvement over the weekend. He notes clear production. He is sleeping sitting in recliner with nebulizer which he notes helps.  WBC             6.6   04/08/2013 HGB            14.0   04/08/2013 HCT            40.7   04/08/2013 PLT             177   04/08/2013 GLUCOSE         140   04/08/2013 CHOL            162   04/08/2013 TRIG             75   04/08/2013 HDL              56   04/08/2013 LDLCALC          91   04/08/2013 ALT              11   04/08/2013 AST              18   04/08/2013 NA              141   04/08/2013 K               4.1   04/08/2013 CL              103   04/08/2013 CREATININE     1.35   04/08/2013 BUN              16   04/08/2013 CO2              29   04/08/2013 TSH           0.886   04/08/2013 INR            0.93   05/04/2010 HGBA1C          5.8   04/08/2013 MICROALBUR     0.50   12/11/2012     Medication List       This list is accurate as of: 06/30/13  9:20 AM.  Always use your most recent med list.               albuterol 108 (90 BASE) MCG/ACT inhaler  Commonly known as:  PROAIR HFA  Inhale 2 puffs into the lungs every 6 (six) hours as needed for wheezing or shortness of breath.     aspirin 81 MG tablet  Take 81 mg by mouth daily.     budesonide-formoterol 160-4.5 MCG/ACT inhaler  Commonly known as:  SYMBICORT  Inhale 2 puffs into the lungs 2 (two) times daily. rinse mouth     calcium carbonate 600 MG Tabs tablet  Commonly known as:  OS-CAL  Take 600 mg by mouth daily.     dextromethorphan-guaiFENesin 30-600 MG per 12 hr tablet  Commonly known as:   [place] DM  Take  1 tablet by mouth 2 (two) times daily.     ibuprofen 200 MG tablet  Commonly known as:  ADVIL,MOTRIN  Take 200 mg by mouth every 6 (six) hours as needed.     ipratropium-albuterol 0.5-2.5 (3) MG/3ML Soln  Commonly known as:  DUONEB  Take 3 mLs by nebulization every 4 (four) hours as needed. DX COPD 496     VITAMIN D-3 PO  Take 5,000 Units by mouth daily.       No Known Allergies  Past Medical History  Diagnosis Date  . Epistaxis age 5    after blunt facial trauma. needed surgical clamps and 40 units of blood  . COPD (chronic obstructive pulmonary disease)   . Hyperlipidemia   . DDD (degenerative disc disease)   . Unspecified vitamin D deficiency   . Elevated blood pressure reading without diagnosis of hypertension   . DJD (degenerative joint disease)       Review of Systems  Respiratory: Positive for cough and shortness of breath.   All other systems reviewed and are negative.  BP 150/86  Pulse 94  Temp(Src) 98.6 F (37 C) (Temporal)  Resp 18  Ht 5\' 10"  (1.778 m)  Wt 150 lb (68.04 kg)  BMI 21.52 kg/m2  SpO2 96%     Objective:   Physical Exam  Nursing note and vitals reviewed. Constitutional: He is oriented to person, place, and time. He appears well-developed and well-nourished.  HENT:  Head: Normocephalic and atraumatic.  Right Ear: External ear normal.  Left Ear: External ear normal.  Nose: Nose normal.  Eyes: Conjunctivae and EOM are normal.  Neck: Normal range of motion. Neck supple. No JVD present. No thyromegaly present.  Cardiovascular: Normal rate, regular rhythm, normal heart sounds and intact distal pulses.   Pulmonary/Chest: Effort normal.  ? rhonchi  Abdominal: Soft. Bowel sounds are normal. He exhibits no distension and no mass. There is no tenderness. There is no rebound and no guarding.  Musculoskeletal: Normal range of motion. He exhibits no edema and no tenderness.  Lymphadenopathy:    He has no cervical adenopathy.   Neurological: He is alert and oriented to person, place, and time. He has normal reflexes. No cranial nerve deficit. Coordination normal.  Skin: Skin is warm and dry.  Psychiatric: He has a normal mood and affect. His behavior is normal. Judgment and thought content normal.          Assessment & Plan:  1.  3 month F/U for HTN, Cholesterol, Pre-Dm, D. Deficient. Needs healthy diet, cardio QD and obtain healthy weight. Check Labs, Check BP if >130/80 call office  2 COPD- Move up Pulm f/u, get CXR, Dexam 10 mg today start Pred DP 10 mg AD, w/c if SX increase or ER.

## 2013-07-01 ENCOUNTER — Other Ambulatory Visit: Payer: Self-pay | Admitting: Emergency Medicine

## 2013-07-01 DIAGNOSIS — M8580 Other specified disorders of bone density and structure, unspecified site: Secondary | ICD-10-CM

## 2013-07-01 MED ORDER — PREDNISONE 10 MG PO TABS: ORAL_TABLET | ORAL | Status: DC

## 2013-07-07 ENCOUNTER — Ambulatory Visit (HOSPITAL_COMMUNITY)
Admission: RE | Admit: 2013-07-07 | Discharge: 2013-07-07 | Disposition: A | Discharge status: Home / Self Care | Payer: Medicare HMO | Source: Ambulatory Visit | Attending: Emergency Medicine | Admitting: Emergency Medicine

## 2013-07-07 DIAGNOSIS — M8580 Other specified disorders of bone density and structure, unspecified site: Secondary | ICD-10-CM

## 2013-07-07 DIAGNOSIS — M949 Disorder of cartilage, unspecified: Secondary | ICD-10-CM

## 2013-07-07 DIAGNOSIS — Z8781 Personal history of (healed) traumatic fracture: Secondary | ICD-10-CM | POA: Insufficient documentation

## 2013-07-07 DIAGNOSIS — M899 Disorder of bone, unspecified: Secondary | ICD-10-CM | POA: Insufficient documentation

## 2013-07-07 DIAGNOSIS — Z13820 Encounter for screening for osteoporosis: Secondary | ICD-10-CM | POA: Insufficient documentation

## 2013-07-09 ENCOUNTER — Other Ambulatory Visit: Payer: Self-pay | Admitting: Emergency Medicine

## 2013-07-09 MED ORDER — ALENDRONATE SODIUM 70 MG PO TABS: 70.0000 mg | ORAL_TABLET | ORAL | Status: DC

## 2013-07-11 ENCOUNTER — Ambulatory Visit: Payer: Self-pay | Admitting: Emergency Medicine

## 2013-07-21 ENCOUNTER — Ambulatory Visit (INDEPENDENT_AMBULATORY_CARE_PROVIDER_SITE_OTHER): Payer: Commercial Managed Care - HMO | Admitting: Internal Medicine

## 2013-07-21 ENCOUNTER — Encounter: Payer: Self-pay | Admitting: Internal Medicine

## 2013-07-21 VITALS — BP 142/80 | HR 102 | Ht 70.5 in | Wt 149.8 lb

## 2013-07-21 DIAGNOSIS — J432 Centrilobular emphysema: Secondary | ICD-10-CM

## 2013-07-21 DIAGNOSIS — F17200 Nicotine dependence, unspecified, uncomplicated: Secondary | ICD-10-CM

## 2013-07-21 DIAGNOSIS — J438 Other emphysema: Secondary | ICD-10-CM

## 2013-07-21 DIAGNOSIS — J449 Chronic obstructive pulmonary disease, unspecified: Secondary | ICD-10-CM

## 2013-07-21 MED ORDER — ARFORMOTEROL TARTRATE 15 MCG/2ML IN NEBU: 15.0000 ug | INHALATION_SOLUTION | Freq: Two times a day (BID) | RESPIRATORY_TRACT | Status: DC

## 2013-07-21 MED ORDER — FLUTICASONE FUROATE-VILANTEROL 100-25 MCG/INH IN AEPB: 1.0000 | INHALATION_SPRAY | Freq: Every day | RESPIRATORY_TRACT | Status: DC

## 2013-07-21 NOTE — Progress Notes (Signed)
Subjective:    Patient ID: [name], male    DOB: December 12, 1945, 68 y.o.   MRN: [id number]  HPI 06/23/10- 68 yo tobacco user 1/2 ppd, followed for COPD   here with dtr. Last here October 27, 2009. Since then had back surgery -did ok. For last 2-3 weeks had been tighter through chest, difficult to clear thick mucus without fever, green or sore throat. Just feels he can't clear mucus. Has had 2 steroid shots for this this in past month. Taking doxycycline and just finished prednisone. Neb with albuterol opens him only transiently. DOE but comfortable at rest. Denies chest pain. Has had a chest CT at [facility]. CT w/cm 04/01/10- emphysema, NAD, left apical scarring, compression fx's T8, T12 Nuclear bone scan 04/27/10- uptake c/w T8 vertebral compression fx.  07/06/11- 68 yo tobacco user 1/2 ppd, followed for COPD   here with dtr.  Patient states about the same as last visit. c/o  occasional sob and wheezing. Denies chest pain and chest tightness.  He is still smoking 3 packs per week, but considers this improved. We discussed cessation again.  He reports 3 episodes of thrush, using Symbicort. Discussed mouth care and aerochamber. DOE limits stairs and brisk walk. Cough productive clear white. Denies chest pain, blood. Sensitive to weather, cold, humidity.  07/05/12- 68 yo smoker, followed for COPD  FOLLOWS FOR: gives out quickly if trying to do too much; SOB as well. Has to pace himself. Reduced cigarettes to 4 or 5 a day and trying to do better. Breathing limits exercise tolerance. Wheeze, cough, clear sputum. Prescription for prednisone taper from Dr Melford Aase he did not fill. CXR 05/16/12 IMPRESSION:  COPD with stable hyperinflation. No new focal or acute abnormality  identified  Original Report Authenticated By: Ponciano Ort, M.D. PFT 07/02/2009-severe emphysema-FEV1/FVC 0.33, DLCO 71%.  09/04/12- 68 yo smoker, followed for COPD  FOLLOWS FOR: pt denies any concerns at this time-- breathing is doing  well Feeling much better. Likes the theophylline but says it is a tier IV with his insurance. Felt claustrophobic doing PFT. 6MWT 07/23/12- 97%, 94%, 98%, 342 m. No oxygen limitation on this test. PFT- 07/23/12- . Severe obstructive airways disease with insignificant response to bronchodilator. Diffusion severely reduced. FEC 1.75/37%, FEV1 0.64/18%, FEV1/FVC 0.36/48%, FEF 25-75% 38/48% predicted. DLCO 44%.  03/06/13- 68 yo smoker, followed for COPD, tobacco use, HBP, CKD III, Vert compression fx FOLLOWS FOR:  Breathing doing well- no concerns today Dislikes the cold weather but no acute problem . 07/21/13- 68 yo smoker, followed for COPD  FOLLOWS FOR:  Increased sob, wheezing over the past month Admits increased shortness of breath in humid weather but no recent infection. Sputum is clear. Using nebulizer several times daily. Prednisone trial had not helped. Failed Spiriva. Theophylline too expensive. No effort to stop smoking CXR 06/30/13 IMPRESSION:  COPD without acute abnormality.  Electronically Signed  By: Franchot Gallo M.D.  On: 06/30/2013 10:31   ROS-see HPI Constitutional:   No-   weight loss, night sweats, fevers, chills, fatigue, lassitude. HEENT:   No-  headaches, difficulty swallowing, tooth/dental problems, sore throat,       No-  sneezing, itching, ear ache, nasal congestion, post nasal drip,  CV:  No-   chest pain, orthopnea, PND, swelling in lower extremities, anasarca, dizziness, palpitations Resp: + shortness of breath with exertion or at rest.              + productive cough,  No non-productive cough,  No- coughing up  of blood.              No-   change in color of mucus.  + wheezing.   Skin: No-   rash or lesions. GI:    GU:  MS:  No-   joint pain or swelling.   Neuro-     nothing unusual Psych:  No- change in mood or affect. No depression or anxiety.  No memory loss.  OBJ- Physical Exam General- Alert, Oriented, Affect-appropriate, Distress- none acute,  slender Skin- rash-none, lesions- none, excoriation- none Lymphadenopathy- none Head- atraumatic            Eyes- Gross vision intact, PERRLA, conjunctivae and secretions clear            Ears- Hearing, canals-normal            Nose- clear, no-Septal dev, mucus, polyps, erosion, perforation             Throat- Mallampati II , mucosa clear , drainage- none, tonsils- atrophic Neck- flexible , trachea midline, no stridor , thyroid nl, carotid no bruit Chest - symmetrical excursion , unlabored           Heart/CV- RRR , no murmur , no gallop  , no rub, nl s1 s2                           - JVD- none , edema- none, stasis changes- none, varices- none           Lung- +distant, wheeze-none, cough- none , dullness-none, rub- none           Chest wall-  Abd-  Br/ Gen/ Rectal- Not done, not indicated Extrem- cyanosis- none, clubbing, none, atrophy- none, strength- nl Neuro- grossly intact to observation

## 2013-07-21 NOTE — Patient Instructions (Addendum)
Sample Breo Ellipta 1 puff then rinse mouth, once daily      Try this instead of Symbicort. When done with the sample, go back to Symbicort for comparison  Samples Brovana or Perforomist neb solution    Try using this twice daily by nebulizer. We are looking to see if this lasts enough longer than your usual neb solution to be useful.  Order- [place] room air,  Dx COPD

## 2013-07-24 ENCOUNTER — Telehealth: Payer: Self-pay | Admitting: Internal Medicine

## 2013-07-24 NOTE — Telephone Encounter (Signed)
Pt aware that once ONO results received/reviewed, we will contact him with these results.  Nothing further needed.

## 2013-07-29 ENCOUNTER — Telehealth: Payer: Self-pay | Admitting: Internal Medicine

## 2013-07-29 DIAGNOSIS — J439 Emphysema, unspecified: Secondary | ICD-10-CM

## 2013-07-29 NOTE — Telephone Encounter (Signed)
ONO results attached to printed phone message for CY to review and advise on. Also, patient states that he feels like the [place] did not help at all-no relief. Pt states he used the 2 new meds from CY for only 2 days. Would like to have further suggestions from CY of what to do next. Pt aware he will get a call back tomorrow Wed 07-30-13.

## 2013-07-30 MED ORDER — THEOPHYLLINE ER 200 MG PO TB12: 200.0000 mg | ORAL_TABLET | Freq: Two times a day (BID) | ORAL | Status: DC

## 2013-07-30 NOTE — Telephone Encounter (Signed)
Pt aware of results. Order placed for O2 RX for theophylline sent to [facility] per pt request. Nothing further needed

## 2013-07-30 NOTE — Telephone Encounter (Signed)
Lungs are worn out. Must stop smoking now ! Qualifies for Os 2L during sleep for dx COPD with emphysema- please order  Suggest Rx theophylline 200 mg, 1 twice daily with meals, # 60, ref x 5

## 2013-08-22 ENCOUNTER — Encounter: Payer: Self-pay | Admitting: Internal Medicine

## 2013-08-28 ENCOUNTER — Other Ambulatory Visit: Payer: Self-pay | Admitting: *Deleted

## 2013-08-28 MED ORDER — BUDESONIDE-FORMOTEROL FUMARATE 160-4.5 MCG/ACT IN AERO: 2.0000 | INHALATION_SPRAY | Freq: Two times a day (BID) | RESPIRATORY_TRACT | Status: DC

## 2013-09-03 ENCOUNTER — Encounter: Payer: Self-pay | Admitting: Internal Medicine

## 2013-09-03 ENCOUNTER — Ambulatory Visit: Payer: Medicare HMO | Admitting: Internal Medicine

## 2013-09-03 ENCOUNTER — Ambulatory Visit (INDEPENDENT_AMBULATORY_CARE_PROVIDER_SITE_OTHER): Payer: Commercial Managed Care - HMO | Admitting: Internal Medicine

## 2013-09-03 VITALS — BP 120/66 | HR 84 | Ht 70.5 in | Wt 151.2 lb

## 2013-09-03 DIAGNOSIS — J438 Other emphysema: Secondary | ICD-10-CM

## 2013-09-03 DIAGNOSIS — J431 Panlobular emphysema: Secondary | ICD-10-CM

## 2013-09-03 NOTE — Patient Instructions (Signed)
Sample Anoro inhaler- one puff, once daily   Try this instead of Symbicort. When it runs out, return to Symbicort for comparison  Sample Qvar 80 inhaler- 2 puffs, then rinse mouth, twice daily. When it runs out, just stop it.

## 2013-09-03 NOTE — Progress Notes (Signed)
Subjective:    Patient ID: [name], male    DOB: 09/12/1945, 68 y.o.   MRN: [id number]  HPI 06/23/10- 81 yo tobacco user 1/2 ppd, followed for COPD   here with dtr. Last here October 27, 2009. Since then had back surgery -did ok. For last 2-3 weeks had been tighter through chest, difficult to clear thick mucus without fever, green or sore throat. Just feels he can't clear mucus. Has had 2 steroid shots for this this in past month. Taking doxycycline and just finished prednisone. Neb with albuterol opens him only transiently. DOE but comfortable at rest. Denies chest pain. Has had a chest CT at [facility] CT w/cm 04/01/10- emphysema, NAD, left apical scarring, compression fx's T8, T12 Nuclear bone scan 04/27/10- uptake c/w T8 vertebral compression fx.  07/06/11- 7 yo tobacco user 1/2 ppd, followed for COPD   here with dtr.  Patient states about the same as last visit. c/o  occasional sob and wheezing. Denies chest pain and chest tightness.  He is still smoking 3 packs per week, but considers this improved. We discussed cessation again.  He reports 3 episodes of thrush, using Symbicort. Discussed mouth care and aerochamber. DOE limits stairs and brisk walk. Cough productive clear white. Denies chest pain, blood. Sensitive to weather, cold, humidity.  07/05/12- 43 yo smoker, followed for COPD  FOLLOWS FOR: gives out quickly if trying to do too much; SOB as well. Has to pace himself. Reduced cigarettes to 4 or 5 a day and trying to do better. Breathing limits exercise tolerance. Wheeze, cough, clear sputum. Prescription for prednisone taper from Dr Melford Aase he did not fill. CXR 05/16/12 IMPRESSION:  COPD with stable hyperinflation. No new focal or acute abnormality  identified  Original Report Authenticated By: Ponciano Ort, M.D. PFT 07/02/2009-severe emphysema-FEV1/FVC 0.33, DLCO 71%.  09/04/12- 38 yo smoker, followed for COPD  FOLLOWS FOR: pt denies any concerns at this time-- breathing is doing  well Feeling much better. Likes the theophylline but says it is a tier IV with his insurance. Felt claustrophobic doing PFT. 6MWT 07/23/12- 97%, 94%, 98%, 342 m. No oxygen limitation on this test. PFT- 07/23/12- . Severe obstructive airways disease with insignificant response to bronchodilator. Diffusion severely reduced. FEC 1.75/37%, FEV1 0.64/18%, FEV1/FVC 0.36/48%, FEF 25-75% 38/48% predicted. DLCO 44%.  03/06/13- 34 yo smoker, followed for COPD  FOLLOWS FOR:  Breathing doing well- no concerns today Dislikes the cold weather but no acute problem  07/21/13- 28 yo smoker, followed for COPD with emphysema FOLLOWS FOR:  Increased sob, wheezing over the past month CXR 06/30/13 IMPRESSION:  COPD without acute abnormality.  Electronically Signed  By: Franchot Gallo M.D.  On: 06/30/2013 10:31   09/03/13- 03/06/13- 89 yo smoker, followed for COPD with emphysema FOLLOWS FOR: Pt states he uses his O2 occasionally; could not tell a difference with using O2. Describes cough productive white sputum. Says he doesn't feel any different wearing oxygen or not at night. He did not desaturate on 6 minute walk test in 2014.  ROS-see HPI Constitutional:   No-   weight loss, night sweats, fevers, chills, fatigue, lassitude. HEENT:   No-  headaches, difficulty swallowing, tooth/dental problems, sore throat,       No-  sneezing, itching, ear ache, nasal congestion, post nasal drip,  CV:  No-   chest pain, orthopnea, PND, swelling in lower extremities, anasarca, dizziness, palpitations Resp: + shortness of breath with exertion or at rest.              +  productive cough,  No non-productive cough,  No- coughing up of blood.              No-   change in color of mucus.  + wheezing.   Skin: No-   rash or lesions. GI:    GU:  MS:  No-   joint pain or swelling.   Neuro-     nothing unusual Psych:  No- change in mood or affect. No depression or anxiety.  No memory loss.  OBJ- Physical Exam General- Alert, Oriented,  Affect-appropriate, Distress- none acute, slender Skin- rash-none, lesions- none, excoriation- none Lymphadenopathy- none Head- atraumatic            Eyes- Gross vision intact, PERRLA, conjunctivae and secretions clear            Ears- Hearing, canals-normal            Nose- clear, no-Septal dev, mucus, polyps, erosion, perforation             Throat- Mallampati II , mucosa clear , drainage- none, tonsils- atrophic Neck- flexible , trachea midline, no stridor , thyroid nl, carotid no bruit Chest - symmetrical excursion , unlabored           Heart/CV- RRR , no murmur , no gallop  , no rub, nl s1 s2                           - JVD- none , edema- none, stasis changes- none, varices- none           Lung- +distant, +wheeze-bilateral, cough- none , dullness-none, rub- none           Chest wall-  Abd-  Br/ Gen/ Rectal- Not done, not indicated Extrem- cyanosis- none, clubbing, none, atrophy- none, strength- nl Neuro- grossly intact to observation

## 2013-10-06 ENCOUNTER — Encounter: Payer: Self-pay | Admitting: Internal Medicine

## 2013-10-06 ENCOUNTER — Ambulatory Visit (INDEPENDENT_AMBULATORY_CARE_PROVIDER_SITE_OTHER): Payer: Commercial Managed Care - HMO | Admitting: Internal Medicine

## 2013-10-06 VITALS — BP 130/84 | HR 94 | Temp 98.4°F | Resp 18 | Ht 70.0 in | Wt 154.0 lb

## 2013-10-06 DIAGNOSIS — E785 Hyperlipidemia, unspecified: Secondary | ICD-10-CM

## 2013-10-06 DIAGNOSIS — J209 Acute bronchitis, unspecified: Secondary | ICD-10-CM

## 2013-10-06 DIAGNOSIS — R030 Elevated blood-pressure reading, without diagnosis of hypertension: Secondary | ICD-10-CM

## 2013-10-06 DIAGNOSIS — Z79899 Other long term (current) drug therapy: Secondary | ICD-10-CM

## 2013-10-06 DIAGNOSIS — Z23 Encounter for immunization: Secondary | ICD-10-CM

## 2013-10-06 DIAGNOSIS — R7309 Other abnormal glucose: Secondary | ICD-10-CM

## 2013-10-06 DIAGNOSIS — E559 Vitamin D deficiency, unspecified: Secondary | ICD-10-CM

## 2013-10-06 LAB — CBC WITH DIFFERENTIAL/PLATELET
Basophils Absolute: 0.1 10*3/uL (ref 0.0–0.1)
Basophils Relative: 1 % (ref 0–1)
EOS PCT: 4 % (ref 0–5)
Eosinophils Absolute: 0.3 10*3/uL (ref 0.0–0.7)
HCT: 41.2 % (ref 39.0–52.0)
HEMOGLOBIN: 14.1 g/dL (ref 13.0–17.0)
LYMPHS ABS: 1.6 10*3/uL (ref 0.7–4.0)
Lymphocytes Relative: 22 % (ref 12–46)
MCH: 31.5 pg (ref 26.0–34.0)
MCHC: 34.2 g/dL (ref 30.0–36.0)
MCV: 92.2 fL (ref 78.0–100.0)
MONOS PCT: 19 % — AB (ref 3–12)
Monocytes Absolute: 1.4 10*3/uL — ABNORMAL HIGH (ref 0.1–1.0)
Neutro Abs: 3.9 10*3/uL (ref 1.7–7.7)
Neutrophils Relative %: 54 % (ref 43–77)
Platelets: 207 10*3/uL (ref 150–400)
RBC: 4.47 MIL/uL (ref 4.22–5.81)
RDW: 13.2 % (ref 11.5–15.5)
WBC: 7.2 10*3/uL (ref 4.0–10.5)

## 2013-10-06 MED ORDER — PREDNISONE 20 MG PO TABS: ORAL_TABLET | ORAL | Status: DC

## 2013-10-06 MED ORDER — HYDROCODONE-ACETAMINOPHEN 5-325 MG PO TABS: ORAL_TABLET | ORAL | Status: AC

## 2013-10-06 MED ORDER — AZITHROMYCIN 250 MG PO TABS: ORAL_TABLET | ORAL | Status: DC

## 2013-10-06 NOTE — Patient Instructions (Signed)

## 2013-10-06 NOTE — Progress Notes (Signed)
Patient ID: [name], male   DOB: February 16, 1945, 68 y.o.   MRN: [id number]   This very nice 68 y.o.MWM presents for 3 month follow up with Hypertension, COPD, Hyperlipidemia, Pre-Diabetes and Vitamin D Deficiency. Patient also presents with c/o increasing cough, congestion and purulent sputum.    Patient has labile HTN & BP has been monitored expectantly. Today's BP: 130/84 mmHg. Patient has Stage 3 CKD (GFR 53 ml/Min). Patient denies any cardiac type chest pain, palpitations, dyspnea/orthopnea/PND, dizziness, claudication, or dependent edema.   Hyperlipidemia is controlled with diet. Last Lipids were at goal - Total Chol 171; HDL  62; LDL  93; Trig on 786/08/2013.   Also, the patient has history of PreDiabetes and patient denies any symptoms of reactive hypoglycemia, diabetic polys, paresthesias or visual blurring.  Last A1c was  5.7% on 06/30/2013.    Further, Patient has history of Vitamin D Deficiency and patient supplements vitamin D without any suspected side-effects. Last vitamin D was  49 on 04/08/2013.   Medication List   albuterol 108 (90 BASE) MCG/ACT inhaler  Commonly known as:  PROAIR HFA  Inhale 2 puffs into the lungs every 6 (six) hours as needed for wheezing or shortness of breath.     alendronate 70 MG tablet  Commonly known as:  FOSAMAX  Take 1 tablet (70 mg total) by mouth once a week. Take with a full glass of water on an empty stomach.     aspirin 81 MG tablet  Take 81 mg by mouth daily.     budesonide-formoterol 160-4.5 MCG/ACT inhaler  Commonly known as:  SYMBICORT  Inhale 2 puffs into the lungs 2 (two) times daily. rinse mouth     calcium carbonate 600 MG Tabs tablet  Commonly known as:  OS-CAL  Take 600 mg by mouth daily.     dextromethorphan-guaiFENesin 30-600 MG per 12 hr tablet  Commonly known as:  [place] DM  Take 1 tablet by mouth 2 (two) times daily.     ibuprofen 200 MG tablet  Commonly known as:  ADVIL,MOTRIN  Take 200 mg by mouth every 6 (six) hours as  needed.     ipratropium-albuterol 0.5-2.5 (3) MG/3ML Soln  Commonly known as:  DUONEB  Take 3 mLs by nebulization every 4 (four) hours as needed. DX COPD 496     VITAMIN D-3 PO  Take 5,000 Units by mouth daily.     No Known Allergies  PMHx:   Past Medical History  Diagnosis Date  . Epistaxis age 68    after blunt facial trauma. needed surgical clamps and 40 units of blood  . COPD (chronic obstructive pulmonary disease)   . Hyperlipidemia   . DDD (degenerative disc disease)   . Unspecified vitamin D deficiency   . Elevated blood pressure reading without diagnosis of hypertension   . DJD (degenerative joint disease)    FHx:    Reviewed / unchanged SHx:    Reviewed / unchanged  Systems Review:  Constitutional: Denies fever, chills, wt changes, headaches, insomnia, fatigue, night sweats, change in appetite. Eyes: Denies redness, blurred vision, diplopia, discharge, itchy, watery eyes.  ENT: Denies discharge, congestion, post nasal drip, epistaxis, sore throat, earache, hearing loss, dental pain, tinnitus, vertigo, sinus pain, snoring.  CV: Denies chest pain, palpitations, irregular heartbeat, syncope, dyspnea, diaphoresis, orthopnea, PND, claudication or edema. Respiratory: Has dyspnea, DOE, No pleurisy, hoarseness, laryngitis, wheezing.Has productive cough of purulent sputum. Gastrointestinal: Denies dysphagia, odynophagia, heartburn, reflux, water brash, abdominal pain or cramps, nausea, vomiting,  bloating, diarrhea, constipation, hematemesis, melena, hematochezia  or hemorrhoids. Genitourinary: Denies dysuria, frequency, urgency, nocturia, hesitancy, discharge, hematuria or flank pain. Musculoskeletal: Denies arthralgias, myalgias, stiffness, jt. swelling, pain, limping or strain/sprain.  Skin: Denies pruritus, rash, hives, warts, acne, eczema or change in skin lesion(s). Neuro: No weakness, tremor, incoordination, spasms, paresthesia or pain. Psychiatric: Denies confusion,  memory loss or sensory loss. Endo: Denies change in weight, skin or hair change.  Heme/Lymph: No excessive bleeding, bruising or enlarged lymph nodes.  Exam:  BP 130/84  Pulse 94  Temp 98.4 F   Resp 18  Ht 5\' 10"   Wt 154 lb   BMI 22.10  SpO2 93%  Appears well nourished and in no distress. Eyes: PERRLA, EOMs, conjunctiva no swelling or erythema. Sinuses: No frontal/maxillary tenderness ENT/Mouth: EAC's clear, TM's nl w/o erythema, bulging. Nares clear w/o erythema, swelling, exudates. Oropharynx clear without erythema or exudates. Oral hygiene is good. Tongue normal, non obstructing. Hearing intact.  Neck: Supple. Thyroid nl. Car 2+/2+ without bruits, nodes or JVD. Chest: Respirations nl with BS clear & equal w/ few scattered rales, but no rhonchi, wheezing or stridor.  Cor: Heart sounds normal w/ regular rate and rhythm without sig. murmurs, gallops, clicks, or rubs. Peripheral pulses normal and equal  without edema.  Abdomen: Soft & bowel sounds normal. Non-tender w/o guarding, rebound, hernias, masses, or organomegaly.  Lymphatics: Unremarkable.  Musculoskeletal: Full ROM all peripheral extremities, joint stability, 5/5 strength, and normal gait.  Skin: Warm, dry without exposed rashes, lesions or ecchymosis apparent.  Neuro: Cranial nerves intact, reflexes equal bilaterally. Sensory-motor testing grossly intact. Tendon reflexes grossly intact.  Pysch: Alert & oriented x 3.  Insight and judgement nl & appropriate. No ideations.  Assessment and Plan:  1. Hypertension - Continue monitor blood pressure at home. Continue diet/meds same.  2. Hyperlipidemia - Continue diet/meds, exercise,& lifestyle modifications. Continue monitor periodic cholesterol/liver & renal functions   3. Pre-Diabetes - Continue diet, exercise, lifestyle modifications. Monitor appropriate labs.  4. Vitamin D Deficiency - Continue supplementation.  5. COPD w/ AECB - Rx Zpak, Prednisone, Hydrocodone  prn  Recommended regular exercise, BP monitoring, weight control, and discussed med and SE's. Recommended labs to assess and monitor clinical status. Further disposition pending results of labs.

## 2013-10-07 LAB — LIPID PANEL
CHOL/HDL RATIO: 3.1 ratio
Cholesterol: 161 mg/dL (ref 0–200)
HDL: 52 mg/dL (ref >39)
LDL Cholesterol: 92 mg/dL (ref 0–99)
Triglycerides: 83 mg/dL (ref <150)
VLDL: 17 mg/dL (ref 0–40)

## 2013-10-07 LAB — MAGNESIUM: MAGNESIUM: 2.2 mg/dL (ref 1.5–2.5)

## 2013-10-07 LAB — HEMOGLOBIN A1C
HEMOGLOBIN A1C: 5.8 % — AB (ref <5.7)
MEAN PLASMA GLUCOSE: 120 mg/dL — AB (ref <117)

## 2013-10-07 LAB — HEPATIC FUNCTION PANEL
ALBUMIN: 4.5 g/dL (ref 3.5–5.2)
ALT: 10 U/L (ref 0–53)
AST: 18 U/L (ref 0–37)
Alkaline Phosphatase: 79 U/L (ref 39–117)
Bilirubin, Direct: 0.1 mg/dL (ref 0.0–0.3)
Indirect Bilirubin: 0.5 mg/dL (ref 0.2–1.2)
TOTAL PROTEIN: 6.8 g/dL (ref 6.0–8.3)
Total Bilirubin: 0.6 mg/dL (ref 0.2–1.2)

## 2013-10-07 LAB — BASIC METABOLIC PANEL WITH GFR
BUN: 13 mg/dL (ref 6–23)
CO2: 26 meq/L (ref 19–32)
CREATININE: 1.23 mg/dL (ref 0.50–1.35)
Calcium: 9.3 mg/dL (ref 8.4–10.5)
Chloride: 102 mEq/L (ref 96–112)
GFR, EST AFRICAN AMERICAN: 70 mL/min
GFR, Est Non African American: 61 mL/min
GLUCOSE: 78 mg/dL (ref 70–99)
Potassium: 4.3 mEq/L (ref 3.5–5.3)
Sodium: 138 mEq/L (ref 135–145)

## 2013-10-07 LAB — TSH: TSH: 0.724 u[IU]/mL (ref 0.350–4.500)

## 2013-10-07 LAB — INSULIN, FASTING: Insulin fasting, serum: 4.1 u[IU]/mL (ref 2.0–19.6)

## 2013-10-07 LAB — VITAMIN D 25 HYDROXY (VIT D DEFICIENCY, FRACTURES): VIT D 25 HYDROXY: 35 ng/mL (ref 30–89)

## 2013-10-12 NOTE — Assessment & Plan Note (Signed)
Counseled again and offered support

## 2013-10-12 NOTE — Assessment & Plan Note (Signed)
Severe COPD but not willing to stop smoking Plan-sample Breo, sample Brovan or Perforomist for nebulizer, schedule ONOX

## 2013-10-13 ENCOUNTER — Ambulatory Visit: Payer: Self-pay | Admitting: Physician Assistant

## 2013-10-14 ENCOUNTER — Encounter: Payer: Self-pay | Admitting: Gastroenterology

## 2013-10-17 ENCOUNTER — Encounter: Payer: Self-pay | Admitting: Internal Medicine

## 2013-10-17 ENCOUNTER — Ambulatory Visit (INDEPENDENT_AMBULATORY_CARE_PROVIDER_SITE_OTHER): Payer: Commercial Managed Care - HMO | Admitting: Internal Medicine

## 2013-10-17 VITALS — BP 158/80 | HR 84 | Temp 98.0°F | Resp 20 | Ht 70.0 in | Wt 153.0 lb

## 2013-10-17 DIAGNOSIS — R109 Unspecified abdominal pain: Secondary | ICD-10-CM

## 2013-10-17 MED ORDER — DICYCLOMINE HCL 20 MG PO TABS: 20.0000 mg | ORAL_TABLET | Freq: Three times a day (TID) | ORAL | Status: DC | PRN

## 2013-10-17 NOTE — Patient Instructions (Signed)

## 2013-10-17 NOTE — Progress Notes (Signed)
Subjective:    Patient ID: [name], male    DOB: Jun 29, 1945, 68 y.o.   MRN: [id number]  HPI Patient presents with c/o intermittent  Abdominal bloating x 1 year. No assoc N/V/ D/C or correlation with food intake or elimination. Denies Sx's consistent with reflux or water brash. [name] has tried Ibupro with relief. He's noted no particular precipitating factors.   Medication List   albuterol 108 (90 BASE) MCG/ACT inhaler  Commonly known as:  PROAIR HFA  Inhale 2 puffs into the lungs every 6 (six) hours as needed for wheezing or shortness of breath.     alendronate 70 MG tablet  Commonly known as:  FOSAMAX  Take 1 tablet (70 mg total) by mouth once a week. Take with a full glass of water on an empty stomach.     aspirin 81 MG tablet  Take 81 mg by mouth daily.     budesonide-formoterol 160-4.5 MCG/ACT inhaler  Commonly known as:  SYMBICORT  Inhale 2 puffs into the lungs 2 (two) times daily. rinse mouth     calcium carbonate 600 MG Tabs tablet  Commonly known as:  OS-CAL  Take 600 mg by mouth daily.     dextromethorphan-guaiFENesin 30-600 MG per 12 hr tablet  Commonly known as:  MUCINEX DM  Take 1 tablet by mouth 2 (two) times daily.     dicyclomine 20 MG tablet  Commonly known as:  BENTYL  Take 1 tablet (20 mg total) by mouth 3 (three) times daily as needed for spasms.     HYDROcodone-acetaminophen 5-325 MG per tablet  Commonly known as:  NORCO  Take 1/2 to 1 tablet every 3 to 4 hours as needed for cough     ibuprofen 200 MG tablet  Commonly known as:  ADVIL,MOTRIN  Take 200 mg by mouth every 6 (six) hours as needed.     ipratropium-albuterol 0.5-2.5 (3) MG/3ML Soln  Commonly known as:  DUONEB  Take 3 mLs by nebulization every 4 (four) hours as needed. DX COPD 496     theophylline 200 MG 12 hr tablet  Commonly known as:  THEODUR     VITAMIN D-3 PO  Take 5,000 Units by mouth daily.     No Known Allergies  Past Medical History  Diagnosis Date  . Epistaxis age 30     after blunt facial trauma. needed surgical clamps and 40 units of blood  . COPD (chronic obstructive pulmonary disease)   . Hyperlipidemia   . DDD (degenerative disc disease)   . Unspecified vitamin D deficiency   . Elevated blood pressure reading without diagnosis of hypertension   . DJD (degenerative joint disease)    Review of Systems In addition to the HPI above,  No Fever-chills,  No Headache, No changes with Vision or hearing,  No problems swallowing food or Liquids,  No Chest Pain or productive Cough or Shortness of Breath,  No Nausea or Vomitting, Bowel movements are regular,  No Blood in stool or Urine,  No dysuria,  No new skin rashes or bruises,  No new joints pains-aches,  No new weakness, tingling, numbness in any extremity,  No recent weight loss,  No polyuria, polydypsia or polyphagia,  Denies Mental Stressors.  A full 10 point Review of Systems was done, except as stated above, all other Review of Systems were negative  Objective:   Physical Exam BP 158/80  P 84  T 98 F   R 20  Ht 5\' 10"   Wt 153 lb   BMI 21.95   HEENT - Eac's patent. TM's Nl. EOM's full. PERRLA. NasoOroPharynx clear. Neck - supple. Nl Thyroid. Carotids 2+ & No bruits, nodes, JVD Chest - Distant BS w/o scattered rales but no rhonchi, wheezes. Cor - Nl HS. RRR w/o sig MGR. No edema. Abd - No palpable organomegaly or masses. BS nl. Sl tender in mid abdominal area MS- FROM w/o deformities. Muscle power, tone and bulk Nl. Gait Nl. Neuro - No obvious Cr N abnormalities. Sensory, motor and Cerebellar functions appear Nl w/o focal abnormalities. Psyche - Mental status normal & appropriate.  No delusions, ideations or obvious mood abnormalities.  Assessment & Plan:   1. Abdominal pain, uncertain etiology   Empiric trial with - dicyclomine (BENTYL) 20 MG tablet; Take 1 tablet (20 mg total) by mouth 3 (three) times daily as needed for spasms.  Dispense: 90 tablet; Refill: 1

## 2013-10-20 ENCOUNTER — Other Ambulatory Visit: Payer: Self-pay | Admitting: Internal Medicine

## 2013-10-20 DIAGNOSIS — R109 Unspecified abdominal pain: Secondary | ICD-10-CM

## 2013-10-22 ENCOUNTER — Other Ambulatory Visit: Payer: Self-pay | Admitting: *Deleted

## 2013-10-22 DIAGNOSIS — R109 Unspecified abdominal pain: Secondary | ICD-10-CM

## 2013-10-28 ENCOUNTER — Ambulatory Visit
Admission: RE | Admit: 2013-10-28 | Discharge: 2013-10-28 | Disposition: A | Discharge status: Home / Self Care | Payer: Commercial Managed Care - HMO | Source: Ambulatory Visit | Attending: Internal Medicine | Admitting: Internal Medicine

## 2013-10-28 DIAGNOSIS — R109 Unspecified abdominal pain: Secondary | ICD-10-CM

## 2013-10-28 MED ORDER — IOHEXOL 300 MG/ML  SOLN
100.0000 mL | Freq: Once | INTRAMUSCULAR | Status: AC | PRN
  Administered 2013-10-28: 100 mL via INTRAVENOUS

## 2013-11-03 ENCOUNTER — Encounter: Payer: Self-pay | Admitting: Internal Medicine

## 2013-11-03 ENCOUNTER — Other Ambulatory Visit: Payer: Self-pay | Admitting: Internal Medicine

## 2013-11-03 ENCOUNTER — Ambulatory Visit (INDEPENDENT_AMBULATORY_CARE_PROVIDER_SITE_OTHER): Payer: Commercial Managed Care - HMO | Admitting: Internal Medicine

## 2013-11-03 VITALS — BP 138/92 | HR 103 | Ht 70.5 in | Wt 152.4 lb

## 2013-11-03 DIAGNOSIS — R0600 Dyspnea, unspecified: Secondary | ICD-10-CM

## 2013-11-03 DIAGNOSIS — R911 Solitary pulmonary nodule: Secondary | ICD-10-CM

## 2013-11-03 NOTE — Progress Notes (Signed)
Subjective:    Patient ID: [name], male    DOB: 02/01/1945, 68 y.o.   MRN: [id number]  HPI 06/23/10- 67 yo tobacco user 1/2 ppd, followed for COPD   here with dtr. Last here October 27, 2009. Since then had back surgery -did ok. For last 2-3 weeks had been tighter through chest, difficult to clear thick mucus without fever, green or sore throat. Just feels he can't clear mucus. Has had 2 steroid shots for this this in past month. Taking doxycycline and just finished prednisone. Neb with albuterol opens him only transiently. DOE but comfortable at rest. Denies chest pain. Has had a chest [facility] at [facility]. CT w/cm 04/01/10- emphysema, NAD, left apical scarring, compression fx's T8, T12 Nuclear bone scan 04/27/10- uptake c/w T8 vertebral compression fx.  07/06/11- 74 yo tobacco user 1/2 ppd, followed for COPD   here with dtr.  Patient states about the same as last visit. c/o  occasional sob and wheezing. Denies chest pain and chest tightness.  He is still smoking 3 packs per week, but considers this improved. We discussed cessation again.  He reports 3 episodes of thrush, using Symbicort. Discussed mouth care and aerochamber. DOE limits stairs and brisk walk. Cough productive clear white. Denies chest pain, blood. Sensitive to weather, cold, humidity.  07/05/12- 5 yo smoker, followed for COPD  FOLLOWS FOR: gives out quickly if trying to do too much; SOB as well. Has to pace himself. Reduced cigarettes to 4 or 5 a day and trying to do better. Breathing limits exercise tolerance. Wheeze, cough, clear sputum. Prescription for prednisone taper from Dr Melford Aase he did not fill. CXR 05/16/12 IMPRESSION:  COPD with stable hyperinflation. No new focal or acute abnormality  identified  Original Report Authenticated By: Ponciano Ort, M.D. PFT 07/02/2009-severe emphysema-FEV1/FVC 0.33, DLCO 71%.  09/04/12- 63 yo smoker, followed for COPD  FOLLOWS FOR: pt denies any concerns at this time-- breathing is doing  well Feeling much better. Likes the theophylline but says it is a tier IV with his insurance. Felt claustrophobic doing PFT. 6MWT 07/23/12- 97%, 94%, 98%, 342 m. No oxygen limitation on this test. PFT- 07/23/12- . Severe obstructive airways disease with insignificant response to bronchodilator. Diffusion severely reduced. FEC 1.75/37%, FEV1 0.64/18%, FEV1/FVC 0.36/48%, FEF 25-75% 38/48% predicted. DLCO 44%.  03/06/13- 48 yo smoker, followed for COPD  FOLLOWS FOR:  Breathing doing well- no concerns today Dislikes the cold weather but no acute problem  07/21/13- 57 yo smoker, followed for COPD  FOLLOWS FOR:  Increased sob, wheezing over the past month  CXR 06/30/13 IMPRESSION:  COPD without acute abnormality.  Electronically Signed  By: Franchot Gallo M.D.  On: 06/30/2013 10:31   09/03/13-67 yo smoker, followed for COPD  FOLLOWS FOR: Pt states he uses his O2 occasionally; could not tell a difference with using O2. Recent CT abd noted 5 mm RLL nodule not mentioned on report from CT chest 2012. Also noted atherosclerosis.    11/03/13 -63 yo smoker, followed for COPD  FOLLOWS FOR: Gives out quickly when up moving around-recently went on golfing trip and could tell the breathing is off when swinging his club. We had given samples of Anoro and Qvar 80 to compare with Symbicort    ROS-see HPI Constitutional:   No-   weight loss, night sweats, fevers, chills, fatigue, lassitude. HEENT:   No-  headaches, difficulty swallowing, tooth/dental problems, sore throat,       No-  sneezing, itching, ear ache, nasal congestion, post  nasal drip,  CV:  No-   chest pain, orthopnea, PND, swelling in lower extremities, anasarca, dizziness, palpitations Resp: + shortness of breath with exertion or at rest.              + productive cough,  No non-productive cough,  No- coughing up of blood.              No-   change in color of mucus.  + wheezing.   Skin: No-   rash or lesions. GI:    GU:  MS:  No-   joint  pain or swelling.   Neuro-     nothing unusual Psych:  No- change in mood or affect. No depression or anxiety.  No memory loss.  OBJ- Physical Exam General- Alert, Oriented, Affect-appropriate, Distress- none acute, slender Skin- rash-none, lesions- none, excoriation- none Lymphadenopathy- none Head- atraumatic            Eyes- Gross vision intact, PERRLA, conjunctivae and secretions clear            Ears- Hearing, canals-normal            Nose- clear, no-Septal dev, mucus, polyps, erosion, perforation             Throat- Mallampati II , mucosa clear , drainage- none, tonsils- atrophic Neck- flexible , trachea midline, no stridor , thyroid nl, carotid no bruit Chest - symmetrical excursion , unlabored           Heart/CV- RRR , no murmur , no gallop  , no rub, nl s1 s2                           - JVD- none , edema- none, stasis changes- none, varices- none           Lung- +distant, +wheeze-expiratory RUL, cough- none , dullness-none, rub- none           Chest wall-  Abd-  Br/ Gen/ Rectal- Not done, not indicated Extrem- cyanosis- none, clubbing, none, atrophy- none, strength- nl Neuro- grossly intact to observation

## 2013-11-03 NOTE — Patient Instructions (Signed)
Order- Schedule future CT chest non contrast dx lung nodule    In 6 months  We can continue present meds  Please call as needed

## 2013-11-13 ENCOUNTER — Other Ambulatory Visit: Payer: Self-pay | Admitting: *Deleted

## 2013-11-13 MED ORDER — BUDESONIDE-FORMOTEROL FUMARATE 80-4.5 MCG/ACT IN AERO
2.0000 | INHALATION_SPRAY | Freq: Two times a day (BID) | RESPIRATORY_TRACT | Status: DC
Start: 2013-11-13 — End: 2014-01-28

## 2013-11-13 MED ORDER — MOMETASONE FURO-FORMOTEROL FUM 100-5 MCG/ACT IN AERO: 2.0000 | INHALATION_SPRAY | Freq: Two times a day (BID) | RESPIRATORY_TRACT | Status: DC

## 2013-11-17 ENCOUNTER — Telehealth: Payer: Self-pay | Admitting: Internal Medicine

## 2013-11-17 DIAGNOSIS — J438 Other emphysema: Secondary | ICD-10-CM

## 2013-11-17 NOTE — Telephone Encounter (Signed)
Spoke with pt--requests that O2 be D/C Needs order sent to [facility]. Pt states that he has not been using this and states it is too expensive.  Dr Annamaria Boots, Please advise thanks.

## 2013-11-17 NOTE — Telephone Encounter (Signed)
Spoke with the pt and notified of recs per CDY  He verbalized understanding  Order was sent to [facility] or [facility]

## 2013-11-17 NOTE — Telephone Encounter (Signed)
We can d/c O2 if he insists,rather than keep paying for what he isn't using. It would be a good idea to repeat an [facility] on room air first, to see if it medically safe for him to sleep without oxygen

## 2013-12-05 ENCOUNTER — Encounter: Payer: Self-pay | Admitting: Cardiovascular Disease

## 2013-12-05 ENCOUNTER — Ambulatory Visit (INDEPENDENT_AMBULATORY_CARE_PROVIDER_SITE_OTHER): Payer: Commercial Managed Care - HMO | Admitting: Cardiovascular Disease

## 2013-12-05 VITALS — BP 140/92 | HR 87 | Ht 70.5 in | Wt 153.0 lb

## 2013-12-05 DIAGNOSIS — R0602 Shortness of breath: Secondary | ICD-10-CM

## 2013-12-05 DIAGNOSIS — R0600 Dyspnea, unspecified: Secondary | ICD-10-CM

## 2013-12-05 DIAGNOSIS — I739 Peripheral vascular disease, unspecified: Secondary | ICD-10-CM

## 2013-12-05 DIAGNOSIS — E785 Hyperlipidemia, unspecified: Secondary | ICD-10-CM

## 2013-12-05 DIAGNOSIS — I70213 Atherosclerosis of native arteries of extremities with intermittent claudication, bilateral legs: Secondary | ICD-10-CM

## 2013-12-05 MED ORDER — ATORVASTATIN CALCIUM 40 MG PO TABS: 40.0000 mg | ORAL_TABLET | Freq: Every day | ORAL | Status: DC

## 2013-12-05 NOTE — Assessment & Plan Note (Addendum)
[name] has symptoms of claudication and also abdominal pain.  He still smokes although he states that he is trying to cut back.   He had a  CT of the abdomen to evaluate his abdominal pain. He was incidentally found to have significant atherosclerosis involving the iliac arteries. We'll get ankle-brachial indexes and also will get a duplex scan of the lower extremities. I suspect that he'll need a referral to the vascular surgeons.  His cholesterol levels are not of elevated but his LDL goal is 70 or less. We'll start him on atorvastatin 40 mg a day. I would like for his medical Dr. To followup with labs but we will anticipate getting labs on him in 6 months to make sure that these are done.

## 2013-12-05 NOTE — Assessment & Plan Note (Signed)
We'll start atorvastatin 40 mg a day. I would like for his general medical Dr. To followup with subsequent labs. I'll see him again in 6 months for one additional followup visit. Anticipate seeing him on an as-needed basis following that.

## 2013-12-05 NOTE — Assessment & Plan Note (Signed)
Very severe obstructive airways disease, mainly emphysema with little response to bronchodilator. We did not demonstrate oxygen desaturation during exercise on a walk test last year and he does not feel a difference wearing oxygen at night. He is wheezing so we will try again with bronchodilator, emphasis on smoking cessation

## 2013-12-05 NOTE — Patient Instructions (Addendum)
Your physician has requested that you have an ankle brachial index (ABI). During this test an ultrasound and blood pressure cuff are used to evaluate the arteries that supply the arms and legs with blood. Allow thirty minutes for this exam. There are no restrictions or special instructions.  Your physician has requested that you have a lower extremity arterial duplex. This test is an ultrasound of the arteries in the legs or arms. It looks at arterial blood flow in the legs and arms. Allow one hour for Lower and Upper Arterial scans. There are no restrictions or special instructions  Your physician has requested that you have an echocardiogram. Echocardiography is a painless test that uses sound waves to create images of your heart. It provides your doctor with information about the size and shape of your heart and how well your heart's chambers and valves are working. This procedure takes approximately one hour. There are no restrictions for this procedure.  Your physician wants you to follow-up in: 6 months with Dr. Acie Fredrickson.  You will receive a reminder letter in the mail two months in advance. If you don't receive a letter, please call our office to schedule the follow-up appointment. Your physician recommends that you return for lab work in (CHOLESTEROL, LIVER, BMET) 6 months on the day of or a few days before your office visit with Dr. Acie Fredrickson.  You will need to FAST for this appointment - nothing to eat or drink after midnight the night before except water.   Your physician has recommended you make the following change in your medication:  START Atorvastatin 40 mg once daily

## 2013-12-05 NOTE — Progress Notes (Signed)
[name] Date of Birth  [birth year]       [facility] Office [address], [place], [place]  [address]   [place], [place]  [address] [place]   Fax  [phone]     Fax [phone]  Problem List: 1. COPD 2. Athersclerosis of the iliac arteries ( found incidentally by CT scan)    History of Present Illness:  [name] is a 68 yo who is sent of her for for look further evaluation of his dyspnea and some  Atherosclerosis that was incidentally seen in his aorta and iliac arteries. He was having a CT scan to evaluate abdominal pain.  He has severe COPD but there was some concern that he may have some component of CHF along with his COPD.   He has had abdominal pain for the past 4-5 months.  The abdominal pain worsens with any activity.  Occurs also after eating.  Denies any chest pain .  Has chronic dyspnea.  He continues to smoke .  Not on any cholesterol levels.  Does not do any regular exercise.  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 Mows with the riding mower.  He gets too short of breath to do any of the harder yard work.  Still smoking 1/3 ppd No ETOH Fam hx - negative for cardiac issues Father had a cerebral hemorrhage.   Current Outpatient Prescriptions on File Prior to Visit  Medication Sig Dispense Refill  . albuterol (PROAIR HFA) 108 (90 BASE) MCG/ACT inhaler Inhale 2 puffs into the lungs every 6 (six) hours as needed for wheezing or shortness of breath. 1 Inhaler 11  . aspirin 81 MG tablet Take 81 mg by mouth daily.    . budesonide-formoterol (SYMBICORT) 160-4.5 MCG/ACT inhaler Inhale 2 puffs into the lungs 2 (two) times daily. rinse mouth 2 Inhaler 0  . budesonide-formoterol (SYMBICORT) 80-4.5 MCG/ACT inhaler Inhale 2 puffs into the lungs 2 (two) times daily. 1 Inhaler 0  . Cholecalciferol (VITAMIN D-3 PO) Take 5,000 Units by mouth daily.    Marland Kitchen dextromethorphan-guaiFENesin (MUCINEX DM)  30-600 MG per 12 hr tablet Take 1 tablet by mouth 2 (two) times daily.      Marland Kitchen ibuprofen (ADVIL,MOTRIN) 200 MG tablet Take 200 mg by mouth every 6 (six) hours as needed.    Marland Kitchen ipratropium-albuterol (DUONEB) 0.5-2.5 (3) MG/3ML SOLN Take 3 mLs by nebulization every 4 (four) hours as needed. DX COPD 496 225 mL prn  . mometasone-formoterol (DULERA) 100-5 MCG/ACT AERO Inhale 2 puffs into the lungs 2 (two) times daily. 8.8 g 0  . theophylline (THEODUR) 200 MG 12 hr tablet Take 200 mg by mouth 2 (two) times daily.      No current facility-administered medications on file prior to visit.    No Known Allergies  Past Medical History  Diagnosis Date  . Epistaxis age 81    after blunt facial trauma. needed surgical clamps and 40 units of blood  . COPD (chronic obstructive pulmonary disease)   . Hyperlipidemia   . DDD (degenerative disc disease)   . Unspecified vitamin D deficiency   . Elevated blood pressure reading without diagnosis of hypertension   . DJD (degenerative joint disease)     Past Surgical History  Procedure Laterality Date  . Bilateral knee arthroscopy    . Surgical hemostasis      in sinuses after tauma  . Prostate  biopsy    . Back surgery  04-2010    disc replacement    History  Smoking status  . Current Every Day Smoker  . Types: Cigarettes  Smokeless tobacco  . Never Used    History  Alcohol Use No    Family History  Problem Relation Age of Onset  . Stroke Father   . Hyperlipidemia Father   . Heart disease Father 42    deceased with cerebral aneurysm  . Heart disease Mother   . Lung disease Mother   . Asthma Mother   . Hypertension Mother   . Hyperlipidemia Mother   . Colon cancer Neg Hx   . Diabetes Sister     Reviw of Systems:  Reviewed in the HPI.  All other systems are negative.  Physical Exam: Blood pressure 140/92, pulse 87, height 5' 10.5" (1.791 m), weight 153 lb (69.4 kg). Wt Readings from Last 3 Encounters:  12/05/13 153 lb (69.4 kg)    11/03/13 152 lb 6.4 oz (69.128 kg)  10/17/13 153 lb (69.4 kg)     General: thin , chronically ill appearing   Head: Normocephalic, atraumatic, sclera non-icteric, mucus membranes are moist,   Neck: Supple. Carotids are 2 + without bruits. No JVD   Lungs: Clear   Heart: RR, occasional PVCs  Abdomen: Soft, non-tender, non-distended with normal bowel sounds.no bruits  Msk:  Strength and tone are normal   Extremities: No clubbing or cyanosis. No edema.  Distal DP  pulses are 1+ and equal   Neuro: CN II - XII intact.  Alert and oriented X 3.   Psych:  Normal   ECG: Nov. 13, 2015:  NSR with occasional PVCs  Assessment / Plan:

## 2013-12-05 NOTE — Assessment & Plan Note (Addendum)
Presents today for further evaluation of some shortness of breath and also because of severe atherosclerosis found in his lower extra piece. There was some concern that he may have some component of congestive heart failure. He has known severe COPD and.  He denies any chest discomfort. All of his complaints are centered around the abdomen and lower abdomen.  We'll get an echocardiogram for further evaluation of his cardiac function. I'll see him back in 6 months. Anticipate seeing him on an as-needed basis after  that unless he's found to have  significant cardiac disease.

## 2013-12-09 ENCOUNTER — Ambulatory Visit (HOSPITAL_COMMUNITY): Payer: Commercial Managed Care - HMO | Attending: Cardiology | Admitting: Cardiology

## 2013-12-09 ENCOUNTER — Ambulatory Visit (HOSPITAL_BASED_OUTPATIENT_CLINIC_OR_DEPARTMENT_OTHER): Payer: Commercial Managed Care - HMO | Admitting: Cardiology

## 2013-12-09 DIAGNOSIS — I739 Peripheral vascular disease, unspecified: Secondary | ICD-10-CM | POA: Diagnosis present

## 2013-12-09 DIAGNOSIS — R0989 Other specified symptoms and signs involving the circulatory and respiratory systems: Secondary | ICD-10-CM

## 2013-12-09 DIAGNOSIS — Z720 Tobacco use: Secondary | ICD-10-CM | POA: Insufficient documentation

## 2013-12-09 DIAGNOSIS — E785 Hyperlipidemia, unspecified: Secondary | ICD-10-CM | POA: Diagnosis not present

## 2013-12-09 DIAGNOSIS — R0602 Shortness of breath: Secondary | ICD-10-CM

## 2013-12-09 DIAGNOSIS — I10 Essential (primary) hypertension: Secondary | ICD-10-CM | POA: Diagnosis not present

## 2013-12-09 DIAGNOSIS — I70213 Atherosclerosis of native arteries of extremities with intermittent claudication, bilateral legs: Secondary | ICD-10-CM

## 2013-12-09 DIAGNOSIS — J449 Chronic obstructive pulmonary disease, unspecified: Secondary | ICD-10-CM | POA: Diagnosis not present

## 2013-12-09 NOTE — Progress Notes (Signed)
Lower arterial doppler and bilateral duplex performed  

## 2013-12-09 NOTE — Progress Notes (Signed)
Echo performed. 

## 2013-12-10 ENCOUNTER — Telehealth: Payer: Self-pay | Admitting: Nurse Practitioner

## 2013-12-10 ENCOUNTER — Ambulatory Visit (INDEPENDENT_AMBULATORY_CARE_PROVIDER_SITE_OTHER): Payer: Commercial Managed Care - HMO | Admitting: Gastroenterology

## 2013-12-10 ENCOUNTER — Encounter: Payer: Self-pay | Admitting: Gastroenterology

## 2013-12-10 VITALS — BP 134/62 | HR 62 | Ht 70.0 in | Wt 152.4 lb

## 2013-12-10 DIAGNOSIS — R1033 Periumbilical pain: Secondary | ICD-10-CM

## 2013-12-10 DIAGNOSIS — Z860100 Personal history of colon polyps, unspecified: Secondary | ICD-10-CM

## 2013-12-10 DIAGNOSIS — I70213 Atherosclerosis of native arteries of extremities with intermittent claudication, bilateral legs: Secondary | ICD-10-CM

## 2013-12-10 DIAGNOSIS — J431 Panlobular emphysema: Secondary | ICD-10-CM

## 2013-12-10 MED ORDER — ATORVASTATIN CALCIUM 80 MG PO TABS: 80.0000 mg | ORAL_TABLET | Freq: Every day | ORAL | Status: DC

## 2013-12-10 MED ORDER — NA SULFATE-K SULFATE-MG SULF 17.5-3.13-1.6 GM/177ML PO SOLN: 1.0000 | Freq: Once | ORAL | Status: DC

## 2013-12-10 MED ORDER — HYOSCYAMINE SULFATE 0.125 MG SL SUBL: 0.1250 mg | SUBLINGUAL_TABLET | SUBLINGUAL | Status: DC | PRN

## 2013-12-10 NOTE — Addendum Note (Signed)
Addended by: Oda Kilts on: 12/10/2013 11:44 AM   Modules accepted: Orders

## 2013-12-10 NOTE — Progress Notes (Signed)
_                                                                                                                History of Present Illness:  [name] a 68 year old white male with history of COPD and colonic polyposis referred for evaluation of abdominal discomfort.  For the last several months he's had very nonspecific bloating in the periumbilical area.  Symptoms may worsen if he eats a large meal.  He is without nausea or pyrosis.  Symptoms are fairly mild.  Recent CT scan, which I reviewed, demonstrated atherosclerotic changes and a small pulmonary nodule  In 2013 multiple adenomatous polyps were removed at colonoscopy.  One year follow-up was recommended.  Patient denies change in bowel habits or rectal bleeding.  Past Medical History  Diagnosis Date  . Epistaxis age 54    after blunt facial trauma. needed surgical clamps and 40 units of blood  . COPD (chronic obstructive pulmonary disease)   . Hyperlipidemia   . DDD (degenerative disc disease)   . Unspecified vitamin D deficiency   . Elevated blood pressure reading without diagnosis of hypertension   . DJD (degenerative joint disease)   . Colon polyps    Past Surgical History  Procedure Laterality Date  . Bilateral knee arthroscopy    . Surgical hemostasis      in sinuses after ta[place]  . Prostate biopsy    . Back surgery  04-2010    disc replacement   family history includes Asthma in his mother; Colon polyps in his brother; Diabetes in his sister; Heart disease in his mother; Heart disease (age of onset: 41) in his father; Hyperlipidemia in his father and mother; Hypertension in his mother; Leukemia in his sister; Lung disease in his mother; Stroke in his father. There is no history of Colon cancer. Current Outpatient Prescriptions  Medication Sig Dispense Refill  . albuterol (PROAIR HFA) 108 (90 BASE) MCG/ACT inhaler Inhale 2 puffs into the lungs every 6 (six) hours as needed for wheezing or shortness of  breath. 1 Inhaler 11  . aspirin 81 MG tablet Take 81 mg by mouth daily.    Marland Kitchen atorvastatin (LIPITOR) 40 MG tablet Take 1 tablet (40 mg total) by mouth daily. 90 tablet 3  . budesonide-formoterol (SYMBICORT) 160-4.5 MCG/ACT inhaler Inhale 2 puffs into the lungs 2 (two) times daily. rinse mouth 2 Inhaler 0  . budesonide-formoterol (SYMBICORT) 80-4.5 MCG/ACT inhaler Inhale 2 puffs into the lungs 2 (two) times daily. 1 Inhaler 0  . Cholecalciferol (VITAMIN D-3 PO) Take 5,000 Units by mouth daily.    Marland Kitchen dextromethorphan-guaiFENesin (MUCINEX DM) 30-600 MG per 12 hr tablet Take 1 tablet by mouth 2 (two) times daily.      Marland Kitchen ibuprofen (ADVIL,MOTRIN) 200 MG tablet Take 200 mg by mouth every 6 (six) hours as needed.    Marland Kitchen ipratropium-albuterol (DUONEB) 0.5-2.5 (3) MG/3ML SOLN Take 3 mLs by nebulization every 4 (four) hours as needed. DX COPD 496 225 mL prn  .  mometasone-formoterol (DULERA) 100-5 MCG/ACT AERO Inhale 2 puffs into the lungs 2 (two) times daily. 8.8 g 0  . theophylline (THEODUR) 200 MG 12 hr tablet Take 200 mg by mouth 2 (two) times daily.      No current facility-administered medications for this visit.   Allergies as of 12/10/2013  . (No Known Allergies)    reports that he has been smoking Cigarettes.  He has been smoking about 0.00 packs per day. He has never used smokeless tobacco. He reports that he does not drink alcohol or use illicit drugs.   Review of Systems: Pertinent positive and negative review of systems were noted in the above HPI section. All other review of systems were otherwise negative.  Vital signs were reviewed in today's medical record Physical Exam: General: Well developed , well nourished, no acute distress Skin: anicteric Head: Normocephalic and atraumatic Eyes:  sclerae anicteric, EOMI Ears: Normal auditory acuity Mouth: No deformity or lesions Neck: Supple, no masses or thyromegaly Lungs: Clear throughout to auscultation Heart: Regular rate and rhythm; no  murmurs, rubs or bruits Abdomen: Soft, non tender and non distended. No masses, hepatosplenomegaly or hernias noted. Normal Bowel sounds Rectal:deferred Musculoskeletal: Symmetrical with no gross deformities  Skin: No lesions on visible extremities Pulses:  Normal pulses noted Extremities: No clubbing, cyanosis, edema or deformities noted Neurological: Alert oriented x 4, grossly nonfocal Cervical Nodes:  No significant cervical adenopathy Inguinal Nodes: No significant inguinal adenopathy Psychological:  Alert and cooperative. Normal mood and affect  See Assessment and Plan under Problem List

## 2013-12-10 NOTE — Telephone Encounter (Signed)
-----   Message from Thayer Headings, MD sent at 12/10/2013 10:19 AM EST ----- ABIs were fairly normal but the arterial duplex ( a more sensitive test) did show mild plaque. He needs more aggressive lipid lowering therapy. Increase atorvastatin to 80 Check lipids/ liver  / BMP in 3 months. I'm not sure that I have a follow up apt with him. It sounds like he has enough going on that I should see him in 6 months.

## 2013-12-10 NOTE — Telephone Encounter (Signed)
Spoke with patient and reviewed echo, ABIs and arterial duplex results and plan of care to increase Atorvastatin to 80 mg once daily.  I scheduled patient for repeat fasting labs Feb. 2016 and advised patient to keep f/u appointment with Dr. Acie Fredrickson for 6 months.  Patient verbalized understanding and agreement.

## 2013-12-10 NOTE — Assessment & Plan Note (Signed)
Severe but stable.

## 2013-12-10 NOTE — Assessment & Plan Note (Signed)
Plan followup colonoscopy 

## 2013-12-10 NOTE — Assessment & Plan Note (Signed)
Symptoms are very nonspecific and are mild.  There is no evidence for intra-abdominal pathology.  Recommendations #1  Trial of hyomax when necessary

## 2013-12-10 NOTE — Patient Instructions (Signed)

## 2013-12-17 ENCOUNTER — Encounter: Payer: Self-pay | Admitting: Emergency Medicine

## 2013-12-24 ENCOUNTER — Ambulatory Visit: Payer: Self-pay | Admitting: Internal Medicine

## 2013-12-24 NOTE — Progress Notes (Signed)
Patient ID: [name], male   DOB: 07/31/1945, 68 y.o.   MRN: [id number]   [name]

## 2014-01-09 ENCOUNTER — Encounter: Payer: Self-pay | Admitting: Gastroenterology

## 2014-01-10 ENCOUNTER — Emergency Department (HOSPITAL_COMMUNITY): Payer: Commercial Managed Care - HMO

## 2014-01-10 ENCOUNTER — Encounter (HOSPITAL_COMMUNITY): Payer: Self-pay | Admitting: Emergency Medicine

## 2014-01-10 ENCOUNTER — Emergency Department (HOSPITAL_COMMUNITY)
Admission: EM | Admit: 2014-01-10 | Discharge: 2014-01-10 | Disposition: A | Discharge status: Home / Self Care | Payer: Commercial Managed Care - HMO | Attending: Emergency Medicine | Admitting: Emergency Medicine

## 2014-01-10 DIAGNOSIS — Z7951 Long term (current) use of inhaled steroids: Secondary | ICD-10-CM | POA: Insufficient documentation

## 2014-01-10 DIAGNOSIS — J441 Chronic obstructive pulmonary disease with (acute) exacerbation: Secondary | ICD-10-CM | POA: Diagnosis not present

## 2014-01-10 DIAGNOSIS — Z86010 Personal history of colonic polyps: Secondary | ICD-10-CM | POA: Insufficient documentation

## 2014-01-10 DIAGNOSIS — E785 Hyperlipidemia, unspecified: Secondary | ICD-10-CM | POA: Insufficient documentation

## 2014-01-10 DIAGNOSIS — E559 Vitamin D deficiency, unspecified: Secondary | ICD-10-CM | POA: Insufficient documentation

## 2014-01-10 DIAGNOSIS — M1990 Unspecified osteoarthritis, unspecified site: Secondary | ICD-10-CM | POA: Insufficient documentation

## 2014-01-10 DIAGNOSIS — J188 Other pneumonia, unspecified organism: Secondary | ICD-10-CM | POA: Diagnosis not present

## 2014-01-10 DIAGNOSIS — R0602 Shortness of breath: Secondary | ICD-10-CM | POA: Diagnosis present

## 2014-01-10 DIAGNOSIS — Z7982 Long term (current) use of aspirin: Secondary | ICD-10-CM | POA: Diagnosis not present

## 2014-01-10 DIAGNOSIS — Z79899 Other long term (current) drug therapy: Secondary | ICD-10-CM | POA: Diagnosis not present

## 2014-01-10 DIAGNOSIS — J680 Bronchitis and pneumonitis due to chemicals, gases, fumes and vapors: Secondary | ICD-10-CM

## 2014-01-10 DIAGNOSIS — Z720 Tobacco use: Secondary | ICD-10-CM | POA: Diagnosis not present

## 2014-01-10 LAB — BASIC METABOLIC PANEL
ANION GAP: 14 (ref 5–15)
BUN: 13 mg/dL (ref 6–23)
CHLORIDE: 99 meq/L (ref 96–112)
CO2: 26 meq/L (ref 19–32)
CREATININE: 1.21 mg/dL (ref 0.50–1.35)
Calcium: 9.5 mg/dL (ref 8.4–10.5)
GFR calc Af Amer: 70 mL/min — ABNORMAL LOW (ref >90)
GFR calc non Af Amer: 61 mL/min — ABNORMAL LOW (ref >90)
Glucose, Bld: 111 mg/dL — ABNORMAL HIGH (ref 70–99)
Potassium: 3.9 mEq/L (ref 3.7–5.3)
SODIUM: 139 meq/L (ref 137–147)

## 2014-01-10 LAB — CBC
HEMATOCRIT: 37.4 % — AB (ref 39.0–52.0)
HEMOGLOBIN: 12.8 g/dL — AB (ref 13.0–17.0)
MCH: 32.2 pg (ref 26.0–34.0)
MCHC: 34.2 g/dL (ref 30.0–36.0)
MCV: 94 fL (ref 78.0–100.0)
Platelets: 150 10*3/uL (ref 150–400)
RBC: 3.98 MIL/uL — ABNORMAL LOW (ref 4.22–5.81)
RDW: 12.8 % (ref 11.5–15.5)
WBC: 10.9 10*3/uL — AB (ref 4.0–10.5)

## 2014-01-10 LAB — PRO B NATRIURETIC PEPTIDE: PRO B NATRI PEPTIDE: 141.5 pg/mL — AB (ref 0–125)

## 2014-01-10 LAB — I-STAT TROPONIN, ED: Troponin i, poc: 0.02 ng/mL (ref 0.00–0.08)

## 2014-01-10 MED ORDER — IPRATROPIUM-ALBUTEROL 0.5-2.5 (3) MG/3ML IN SOLN: 3.0000 mL | Freq: Once | RESPIRATORY_TRACT | Status: DC

## 2014-01-10 MED ORDER — PREDNISONE 20 MG PO TABS: 60.0000 mg | ORAL_TABLET | Freq: Once | ORAL | Status: DC

## 2014-01-10 MED ORDER — LEVOFLOXACIN 750 MG PO TABS
750.0000 mg | ORAL_TABLET | Freq: Once | ORAL | Status: AC
  Administered 2014-01-10: 750 mg via ORAL
  Filled 2014-01-10: qty 1

## 2014-01-10 MED ORDER — IPRATROPIUM-ALBUTEROL 0.5-2.5 (3) MG/3ML IN SOLN
3.0000 mL | RESPIRATORY_TRACT | Status: DC
  Administered 2014-01-10: 3 mL via RESPIRATORY_TRACT
  Filled 2014-01-10: qty 3

## 2014-01-10 MED ORDER — LEVOFLOXACIN 750 MG PO TABS: 750.0000 mg | ORAL_TABLET | Freq: Once | ORAL | Status: DC

## 2014-01-10 MED ORDER — PREDNISONE 20 MG PO TABS
60.0000 mg | ORAL_TABLET | Freq: Once | ORAL | Status: AC
  Administered 2014-01-10: 60 mg via ORAL
  Filled 2014-01-10: qty 3

## 2014-01-10 MED ORDER — IOHEXOL 350 MG/ML SOLN
100.0000 mL | Freq: Once | INTRAVENOUS | Status: AC | PRN
  Administered 2014-01-10: 100 mL via INTRAVENOUS

## 2014-01-10 NOTE — ED Notes (Addendum)
Pt from home via [facility] c/o shortness of breath x 3 days with fever on thursday. Cough present with what patient calls "dark phlegm". Denies pain. Hx of COPD.  Uses Home O2 2L.

## 2014-01-10 NOTE — ED Provider Notes (Signed)
CSN: [id number]     Arrival date & time 01/10/14  1050 History   First MD Initiated Contact with Patient 01/10/14 1050     Chief Complaint  Patient presents with  . Shortness of Breath     (Consider location/radiation/quality/duration/timing/severity/associated sxs/prior Treatment) Patient is a 68 y.o. male presenting with shortness of breath. The history is provided by the patient.  Shortness of Breath Severity:  Moderate Onset quality:  Gradual Duration:  1 week Timing:  Constant Progression:  Worsening Chronicity:  Recurrent Context: URI (treated for bronchitis with Z-pack, no improvement)   Relieved by:  Nothing Worsened by:  Nothing tried Associated symptoms: cough   Associated symptoms: no abdominal pain, no chest pain, no fever and no vomiting     Past Medical History  Diagnosis Date  . Epistaxis age 74    after blunt facial trauma. needed surgical clamps and 40 units of blood  . COPD (chronic obstructive pulmonary disease)   . Hyperlipidemia   . DDD (degenerative disc disease)   . Unspecified vitamin D deficiency   . Elevated blood pressure reading without diagnosis of hypertension   . DJD (degenerative joint disease)   . Colon polyps    Past Surgical History  Procedure Laterality Date  . Bilateral knee arthroscopy    . Surgical hemostasis      in sinuses after [place]  . Prostate biopsy    . Back surgery  04-2010    disc replacement   Family History  Problem Relation Age of Onset  . Stroke Father   . Hyperlipidemia Father   . Heart disease Father 73    deceased with cerebral aneurysm  . Heart disease Mother   . Lung disease Mother   . Asthma Mother   . Hypertension Mother   . Hyperlipidemia Mother   . Colon cancer Neg Hx   . Diabetes Sister   . Colon polyps Brother   . Leukemia Sister    History  Substance Use Topics  . Smoking status: Current Every Day Smoker    Types: Cigarettes  . Smokeless tobacco: Never Used  . Alcohol Use: No     Review of Systems  Constitutional: Negative for fever and chills.  Respiratory: Positive for cough and shortness of breath.   Cardiovascular: Negative for chest pain and leg swelling.  Gastrointestinal: Negative for vomiting and abdominal pain.  All other systems reviewed and are negative.     Allergies  Review of patient's allergies indicates no known allergies.  Home Medications   Prior to Admission medications   Medication Sig Start Date End Date Taking? Authorizing Provider  albuterol (PROAIR HFA) 108 (90 BASE) MCG/ACT inhaler Inhale 2 puffs into the lungs every 6 (six) hours as needed for wheezing or shortness of breath. 03/06/13 03/06/14 Yes Deneise Lever, MD  aspirin 81 MG tablet Take 81 mg by mouth daily.   Yes Historical Provider, MD  atorvastatin (LIPITOR) 80 MG tablet Take 1 tablet (80 mg total) by mouth daily. 12/10/13  Yes Thayer Headings, MD  budesonide-formoterol [facility]) 160-4.5 MCG/ACT inhaler Inhale 2 puffs into the lungs 2 (two) times daily. rinse mouth 08/28/13 08/28/14 Yes Melissa R Smith, PA-C  Cholecalciferol (VITAMIN D-3 PO) Take 5,000 Units by mouth daily.   Yes Historical Provider, MD  hyoscyamine (LEVSIN SL) 0.125 MG SL tablet Place 1 tablet (0.125 mg total) under the tongue every 4 (four) hours as needed. Patient taking differently: Place 0.125 mg under the tongue every 4 (four) hours  as needed for cramping.  12/10/13  Yes Inda Castle, MD  ibuprofen (ADVIL,MOTRIN) 200 MG tablet Take 400 mg by mouth every 6 (six) hours as needed for headache or moderate pain.    Yes Historical Provider, MD  ipratropium-albuterol (DUONEB) 0.5-2.5 (3) MG/3ML SOLN Take 3 mLs by nebulization every 4 (four) hours as needed. DX COPD 496 03/06/13 04/18/14 Yes Deneise Lever, MD  Nutritional Supplements (COLD AND FLU PO) Take 2 capsules by mouth 2 (two) times daily as needed (for cold).   Yes Historical Provider, MD  theophylline (THEODUR) 200 MG 12 hr tablet Take 200 mg by mouth  2 (two) times daily.  09/29/13  Yes Historical Provider, MD  budesonide-formoterol (SYMBICORT) 80-4.5 MCG/ACT inhaler Inhale 2 puffs into the lungs 2 (two) times daily. Patient not taking: Reported on 01/10/2014 11/13/13   Unk Pinto, MD  mometasone-formoterol [facility]) 100-5 MCG/ACT AERO Inhale 2 puffs into the lungs 2 (two) times daily. Patient not taking: Reported on 01/10/2014 11/13/13   Unk Pinto, MD  Na Sulfate-K Sulfate-Mg Sulf (SUPREP BOWEL PREP) SOLN Take 1 kit by mouth once. Patient not taking: Reported on 01/10/2014 12/10/13   Inda Castle, MD   BP 112/99 mmHg  Pulse 117  Temp(Src) 98.3 F (36.8 C) (Oral)  Resp 25  SpO2 97% Physical Exam  Constitutional: He is oriented to person, place, and time. He appears well-developed and well-nourished. No distress.  HENT:  Head: Normocephalic and atraumatic.  Mouth/Throat: No oropharyngeal exudate.  Eyes: EOM are normal. Pupils are equal, round, and reactive to light.  Neck: Normal range of motion. Neck supple.  Cardiovascular: Normal rate and regular rhythm.  Exam reveals no friction rub.   No murmur heard. Pulmonary/Chest: He is in respiratory distress (mild, with cough). He has wheezes (mild, diffuse). He has no rales.  Abdominal: He exhibits no distension. There is no tenderness. There is no rebound.  Musculoskeletal: Normal range of motion. He exhibits no edema.  Neurological: He is alert and oriented to person, place, and time.  Skin: No rash noted. He is not diaphoretic.  Nursing note and vitals reviewed.   ED Course  Procedures (including critical care time) Labs Review Labs Reviewed  CBC - Abnormal; Notable for the following:    WBC 10.9 (*)    RBC 3.98 (*)    Hemoglobin 12.8 (*)    HCT 37.4 (*)    All other components within normal limits  BASIC METABOLIC PANEL  PRO B NATRIURETIC PEPTIDE  I-STAT TROPOININ, ED    Imaging Review Dg Chest 2 View  01/10/2014   CLINICAL DATA:  Chronic shortness of  breath, progressed past 3 days  EXAM: CHEST  2 VIEW  COMPARISON:  June 30, 2013  FINDINGS: There is underlying emphysematous change. There is mild superimposed interstitial edema, new from prior study. There is a 5 mm nodular opacity in the left mid lung which is stable and may represent a small nipple shadow. There is no airspace consolidation currently. Heart size and pulmonary vascularity are stable and reflect underlying emphysematous change. No adenopathy is appreciable. There is stable anterior wedging of a mid thoracic vertebral body which has undergone kyphoplasty procedure. There is anterior wedging of 2 lower thoracic vertebral bodies which is stable.  IMPRESSION: Mild interstitial edema superimposed on emphysematous change. This finding may indicate a degree of congestive heart failure.  There is no airspace consolidation.  There is a stable 5 mm nodular opacity left mid lung, possibly a nipple shadow.  Repeat study with nipple markers could confirm this observation.  Stable vertebral body compression fractures.   Electronically Signed   By: Lowella Grip M.D.   On: 01/10/2014 11:35     EKG Interpretation   Date/Time:  Saturday January 10 2014 11:10:00 EST Ventricular Rate:  105 PR Interval:  116 QRS Duration: 85 QT Interval:  361 QTC Calculation: 477 R Axis:   86 Text Interpretation:  Sinus tachycardia Borderline right axis deviation  RSR' in V1 or V2, probably normal variant Borderline prolonged QT interval  Confirmed by COOK  MD, BRIAN ([id number]) on 01/10/2014 11:14:13 AM      MDM   Final diagnoses:  Shortness of breath  Chemical pneumonia    60M presents with SOB. Hx of COPD, emphysema. Has been seen by Urgent Care and is finishing up a Z-pack for bronchitis.   No fevers. Has worsening cough with thick, brown phlegm. CXR here negative for infiltrate. Duoneb given.  With negative CXR, will scan for PE, not improving on antibiotics. PE negative, however does have  pneumonia. Patient given levaquin. Discharged with levaquin and steroids.  Evelina Bucy, MD 01/10/14 [phone]

## 2014-01-10 NOTE — ED Notes (Signed)
Bed: WA23 Expected date:  Expected time:  Means of arrival:  Comments: ems 

## 2014-01-13 ENCOUNTER — Encounter: Payer: Self-pay | Admitting: Emergency Medicine

## 2014-01-14 ENCOUNTER — Ambulatory Visit (INDEPENDENT_AMBULATORY_CARE_PROVIDER_SITE_OTHER): Payer: Commercial Managed Care - HMO | Admitting: Physician Assistant

## 2014-01-14 ENCOUNTER — Encounter: Payer: Self-pay | Admitting: Physician Assistant

## 2014-01-14 VITALS — BP 119/80 | HR 100 | Temp 98.1°F | Resp 16 | Ht 70.0 in | Wt 153.0 lb

## 2014-01-14 DIAGNOSIS — R0602 Shortness of breath: Secondary | ICD-10-CM

## 2014-01-14 DIAGNOSIS — R1084 Generalized abdominal pain: Secondary | ICD-10-CM

## 2014-01-14 DIAGNOSIS — M545 Low back pain, unspecified: Secondary | ICD-10-CM

## 2014-01-14 DIAGNOSIS — K5900 Constipation, unspecified: Secondary | ICD-10-CM

## 2014-01-14 MED ORDER — PREDNISONE 20 MG PO TABS: ORAL_TABLET | ORAL | Status: DC

## 2014-01-14 NOTE — Patient Instructions (Signed)
Will give samples of linzess for constipation- take 1 pill daily 30 mins before food Try to do more of a liquid diet for 2-3 days, broths etc.   Follow up with Dr. Deatra Ina Follow with Dr. Annamaria Boots  Go to the ER if any CP, SOB, nausea, dizziness, severe HA, changes vision/speech   Back, Compression Fracture A compression fracture happens when a force is put upon the length of your spine. Slipping and falling on your bottom are examples of such a force. When this happens, sometimes the force is great enough to compress the building blocks (vertebral bodies) of your spine. Although this causes a lot of pain, this can usually be treated at home, unless your caregiver feels hospitalization is needed for pain control. Your backbone (spinal column) is made up of 24 main vertebral bodies in addition to the sacrum and coccyx (see illustration). These are held together by tough fibrous tissues (ligaments) and by support of your muscles. Nerve roots pass through the openings between the vertebrae. A sudden wrenching move, injury, or a fall may cause a compression fracture of one of the vertebral bodies. This may result in back pain or spread of pain into the belly (abdomen), the buttocks, and down the leg into the foot. Pain may also be created by muscle spasm alone. Large studies have been undertaken to determine the best possible course of action to help your back following injury and also to prevent future problems. The recommendations are as follows. FOLLOWING A COMPRESSION FRACTURE: Do the following only if advised by your caregiver.   If a back brace has been suggested or provided, wear it as directed.  Do not stop wearing the back brace unless instructed by your caregiver.  When allowed to return to regular activities, avoid a sedentary lifestyle. Actively exercise. Sporadic weekend binges of tennis, racquetball, or waterskiing may actually aggravate or create problems, especially if you are not in  condition for that activity.  Avoid sports requiring sudden body movements until you are in condition for them. Swimming and walking are safer activities.  Maintain good posture.  Avoid obesity.  If not already done, you should have a DEXA scan. Based on the results, be treated for osteoporosis. FOLLOWING ACUTE (SUDDEN) INJURY:  Only take over-the-counter or prescription medicines for pain, discomfort, or fever as directed by your caregiver.  Use bed rest for only the most extreme acute episode. Prolonged bed rest may aggravate your condition. Ice used for acute conditions is effective. Use a large plastic bag filled with ice. Wrap it in a towel. This also provides excellent pain relief. This may be continuous. Or use it for 30 minutes every 2 hours during acute phase, then as needed. Heat for 30 minutes prior to activities is helpful.  As soon as the acute phase (the time when your back is too painful for you to do normal activities) is over, it is important to resume normal activities and work [employer] information [employer] manager. Back injuries can cause potentially marked changes in lifestyle. So it is important to attack these problems aggressively.  See your caregiver for continued problems. He or she can help or refer you for appropriate exercises, physical therapy, and work hardening if needed.  If you are given narcotic medications for your condition, for the next 24 hours do not:  Drive.  Operate machinery or power tools.  Sign legal documents.  Do not drink alcohol, or take sleeping pills or other medications that may interfere with treatment. If your caregiver  has given you a follow-up appointment, it is very important to keep that appointment. Not keeping the appointment could result in a chronic or permanent injury, pain, and disability. If there is any problem keeping the appointment, you must call back to this facility for assistance.  SEEK IMMEDIATE MEDICAL CARE IF:  You develop  numbness, tingling, weakness, or problems with the use of your arms or legs.  You develop severe back pain not relieved with medications.  You have changes in bowel or bladder control.  You have increasing pain in any areas of the body. Document Released: 01/09/2005 Document Revised: 05/26/2013 Document Reviewed: 08/14/2007 [facility] Patient Information 2015 [place], [place]. This information is not intended to replace advice given to you by your health care provider. Make sure you discuss any questions you have with your health care provider.

## 2014-01-14 NOTE — Progress Notes (Addendum)
Assessment and Plan: Back pain- has history of compression fracture, questionable if this is from lumbar compression fracture- will do prednisone taper longer, get CT lumbar for possible compression fracture AB pain-  has had 12 polyps removed in the past and is over due for colonoscopy- has appointment with Dr. Arlyce Dice, will suggest sooner if possible- will give samples of linzess for constipation and check labs.  Shortness of breath, normal heart, normal CTA- ? From pneumonia with COPD- + 6 min walk test, resting O2 at 95%, desaturation down to 87% with walking less than 20 feet, O2 back up to 94% with 2L O2. Get portable O2, suggest follow up with Dr. Maple Hudson, will do longer predinisone taper, continue albuterol, check labs. ? Benefit from respiratory therapy  Go to the ER if any CP, SOB, nausea, dizziness, severe HA, changes vision/speech OVER 40 minutes of exam, counseling, chart review, referral performed  Addendum: WBC elevated with left shift, will send in doxycycline for patient to start to cover atypical. Has finished levaquin. Spoke with the patient and states he had a good BM yesterday and AB pain is doing better, pain is now localized to lower back. He is on prednisone taper and has portable O2. He will go to the ER if any CP, SOB, nausea, dizziness, severe HA, changes vision/speech.    Quentin Mulling, PA-C 12:46 PM [facility]   HPI 68 y.o.male with history of smoking and COPD, has only smoked two cigs in 1 week- presents for hospital follow up. He states that June/July he was having AB pain, feeling like muscles are in a knot, with SOB, worse with exertion, better with rest after 30-45 mins and states that proair and symbicort made it better, no other accompaniments like CP, diaphoresis, nausea, did complain constipation.  He was golfing, unable to walk to mailbox without SOB. Big change in physical.   Has had Back xray that negative, stable  lumbar.  CT AB on 10/2013 that showed: IMPRESSION: 1. No acute findings to account for the patient's symptoms. 2. Stool burden does not appear excessive despite the history of constipation. 3. 5 mm left lower lobe pulmonary nodule (image 5 of series 3). If the patient is at high risk for bronchogenic carcinoma, follow-up chest CT at 6-12 months is recommended. If the patient is at low risk for bronchogenic carcinoma, follow-up chest CT at 12 months is recommended. This recommendation follows the consensus statement: Guidelines for Management of Small Pulmonary Nodules Detected on CT Scans: A Statement from the Fleischner Society as published in Radiology 2005;237:395-400. 4. 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 5. Normal appendix.  He has osteoporosis, last DEXA 06, has not started fosamax. Saw Dr. Carney Harder, had normal echo. He had a colonoscopy 2013 with Dr. Arlyce Dice and having colonoscopy Jan 27 with Dr. Arlyce Dice.  Then 2-3 days before this Saturday he was having SOB, went to the ER had normal CXR but abnormal CT showing pnuemonia- he has 2 more days of levauqin and has finished prednisone.  IMPRESSION: No evidence of pulmonary embolism.  Scattered tree-in-bud nodularity with mild ground-glass opacity and bronchial wall thickening in the right middle lobe and bilateral lower lobes, likely reflecting mild bronchopneumonia.  Underlying moderate emphysematous changes. No frank interstitial edema.  Mild thoracic lymphadenopathy, favored to be reactive.  Past Medical History  Diagnosis Date  . Epistaxis age 50    after blunt facial trauma. needed surgical clamps and 40 units  of blood  . COPD (chronic obstructive pulmonary disease)   . Hyperlipidemia   . DDD (degenerative disc disease)   . Unspecified vitamin D deficiency   . Elevated blood pressure reading without diagnosis of hypertension   . DJD (degenerative  joint disease)   . Colon polyps      No Known Allergies    Current Outpatient Prescriptions on File Prior to Visit  Medication Sig Dispense Refill  . albuterol (PROAIR HFA) 108 (90 BASE) MCG/ACT inhaler Inhale 2 puffs into the lungs every 6 (six) hours as needed for wheezing or shortness of breath. 1 Inhaler 11  . aspirin 81 MG tablet Take 81 mg by mouth daily.    Marland Kitchen atorvastatin (LIPITOR) 80 MG tablet Take 1 tablet (80 mg total) by mouth daily. 31 tablet 11  . budesonide-formoterol (SYMBICORT) 160-4.5 MCG/ACT inhaler Inhale 2 puffs into the lungs 2 (two) times daily. rinse mouth 2 Inhaler 0  . budesonide-formoterol (SYMBICORT) 80-4.5 MCG/ACT inhaler Inhale 2 puffs into the lungs 2 (two) times daily. (Patient not taking: Reported on 01/10/2014) 1 Inhaler 0  . Cholecalciferol (VITAMIN D-3 PO) Take 5,000 Units by mouth daily.    . hyoscyamine (LEVSIN SL) 0.125 MG SL tablet Place 1 tablet (0.125 mg total) under the tongue every 4 (four) hours as needed. (Patient taking differently: Place 0.125 mg under the tongue every 4 (four) hours as needed for cramping. ) 30 tablet 1  . ibuprofen (ADVIL,MOTRIN) 200 MG tablet Take 400 mg by mouth every 6 (six) hours as needed for headache or moderate pain.     Marland Kitchen ipratropium-albuterol (DUONEB) 0.5-2.5 (3) MG/3ML SOLN Take 3 mLs by nebulization every 4 (four) hours as needed. DX COPD 496 225 mL prn  . levofloxacin (LEVAQUIN) 750 MG tablet Take 1 tablet (750 mg total) by mouth once. 6 tablet 0  . mometasone-formoterol (DULERA) 100-5 MCG/ACT AERO Inhale 2 puffs into the lungs 2 (two) times daily. (Patient not taking: Reported on 01/10/2014) 8.8 g 0  . Na Sulfate-K Sulfate-Mg Sulf (SUPREP BOWEL PREP) SOLN Take 1 kit by mouth once. (Patient not taking: Reported on 01/10/2014) 1 Bottle 0  . Nutritional Supplements (COLD AND FLU PO) Take 2 capsules by mouth 2 (two) times daily as needed (for cold).    . theophylline (THEODUR) 200 MG 12 hr tablet Take 200 mg by mouth 2  (two) times daily.      No current facility-administered medications on file prior to visit.    ROS: all negative except above.   Physical Exam: Filed Weights   01/14/14 1000  Weight: 153 lb (69.4 kg)   BP 119/80 mmHg  Pulse 100  Temp(Src) 98.1 F (36.7 C)  Resp 16  Ht $R'5\' 10"'ao$  (1.778 m)  Wt 153 lb (69.4 kg)  BMI 21.95 kg/m2  SpO2 95% General Appearance: skinny, unable to sit still, no distress Eyes: PERRLA, EOMs, conjunctiva no swelling or erythema Sinuses: No Frontal/maxillary tenderness ENT/Mouth: Ext aud canals clear, TMs without erythema, bulging. No erythema, swelling, or exudate on post pharynx.  Tonsils not swollen or erythematous. Hearing normal.  Neck: Supple, thyroid normal.  Respiratory: Respiratory effort normal with diffuse decreased breath sounds, decreased/wheezing LLL.  + 6 min walk test, resting O2 at 95%, desaturation down to 87% with walking less than 20 feet, O2 back up to 94% with 2L O2.  Cardio: RRR with no MRGs. without edema.  Abdomen: ?AB muscle spasms with decreased BS.  Non tender, + guarding, without rebound, hernias, masses. Lymphatics:  Non tender without lymphadenopathy.  Musculoskeletal: Full ROM, 4/5 strength, antalgic gait.  Skin: Warm, dry without rashes, lesions, ecchymosis.  Neuro: Cranial nerves intact. Normal muscle tone, no cerebellar symptoms.  Psych: Awake and oriented X 3, normal affect, Insight and Judgment appropriate.

## 2014-01-15 LAB — HEPATIC FUNCTION PANEL
ALBUMIN: 3.7 g/dL (ref 3.5–5.2)
ALT: 41 U/L (ref 0–53)
AST: 24 U/L (ref 0–37)
Alkaline Phosphatase: 73 U/L (ref 39–117)
BILIRUBIN INDIRECT: 0.5 mg/dL (ref 0.2–1.2)
BILIRUBIN TOTAL: 0.7 mg/dL (ref 0.2–1.2)
Bilirubin, Direct: 0.2 mg/dL (ref 0.0–0.3)
TOTAL PROTEIN: 6.1 g/dL (ref 6.0–8.3)

## 2014-01-15 LAB — CBC WITH DIFFERENTIAL/PLATELET
Basophils Absolute: 0 10*3/uL (ref 0.0–0.1)
Basophils Relative: 0 % (ref 0–1)
Eosinophils Absolute: 0 10*3/uL (ref 0.0–0.7)
Eosinophils Relative: 0 % (ref 0–5)
HEMATOCRIT: 37 % — AB (ref 39.0–52.0)
HEMOGLOBIN: 12.4 g/dL — AB (ref 13.0–17.0)
LYMPHS PCT: 7 % — AB (ref 12–46)
Lymphs Abs: 0.9 10*3/uL (ref 0.7–4.0)
MCH: 31.2 pg (ref 26.0–34.0)
MCHC: 33.5 g/dL (ref 30.0–36.0)
MCV: 93.2 fL (ref 78.0–100.0)
MONOS PCT: 13 % — AB (ref 3–12)
MPV: 10.8 fL (ref 9.4–12.4)
Monocytes Absolute: 1.7 10*3/uL — ABNORMAL HIGH (ref 0.1–1.0)
NEUTROS ABS: 10.6 10*3/uL — AB (ref 1.7–7.7)
NEUTROS PCT: 80 % — AB (ref 43–77)
Platelets: 247 10*3/uL (ref 150–400)
RBC: 3.97 MIL/uL — AB (ref 4.22–5.81)
RDW: 13.1 % (ref 11.5–15.5)
WBC: 13.2 10*3/uL — ABNORMAL HIGH (ref 4.0–10.5)

## 2014-01-15 LAB — BASIC METABOLIC PANEL WITH GFR
BUN: 17 mg/dL (ref 6–23)
CALCIUM: 9.1 mg/dL (ref 8.4–10.5)
CHLORIDE: 103 meq/L (ref 96–112)
CO2: 28 meq/L (ref 19–32)
Creat: 1.09 mg/dL (ref 0.50–1.35)
GFR, Est African American: 81 mL/min
GFR, Est Non African American: 70 mL/min
GLUCOSE: 82 mg/dL (ref 70–99)
POTASSIUM: 4 meq/L (ref 3.5–5.3)
Sodium: 141 mEq/L (ref 135–145)

## 2014-01-15 LAB — TSH: TSH: 0.3 u[IU]/mL — ABNORMAL LOW (ref 0.350–4.500)

## 2014-01-15 LAB — THEOPHYLLINE LEVEL: THEOPHYLLINE LVL: 4.4 ug/mL — AB (ref 10.0–20.0)

## 2014-01-15 MED ORDER — DOXYCYCLINE HYCLATE 100 MG PO TABS: 100.0000 mg | ORAL_TABLET | Freq: Two times a day (BID) | ORAL | Status: DC

## 2014-01-15 NOTE — Addendum Note (Signed)
Addended by: Vladimir Crofts on: 01/15/2014 09:37 AM   Modules accepted: Orders

## 2014-01-20 ENCOUNTER — Other Ambulatory Visit: Payer: Self-pay

## 2014-01-20 ENCOUNTER — Ambulatory Visit
Admission: RE | Admit: 2014-01-20 | Discharge: 2014-01-20 | Disposition: A | Discharge status: Home / Self Care | Payer: Commercial Managed Care - HMO | Source: Ambulatory Visit | Attending: Physician Assistant | Admitting: Physician Assistant

## 2014-01-20 ENCOUNTER — Telehealth: Payer: Self-pay | Admitting: Internal Medicine

## 2014-01-20 DIAGNOSIS — M545 Low back pain, unspecified: Secondary | ICD-10-CM

## 2014-01-20 MED ORDER — HYDROCODONE-ACETAMINOPHEN 5-325 MG PO TABS: 1.0000 | ORAL_TABLET | Freq: Four times a day (QID) | ORAL | Status: DC | PRN

## 2014-01-20 NOTE — Telephone Encounter (Signed)
Patient's wife called to request pain medicine for [facility]. He is in severe pain in his back.  Also, she would like status of the CT of his Spine.  Please advise.  Please leave voicemail with CT appointment if you do not reach [place] or [place].  Pharm: [facility]  Thank you, [facility] [phone] Fax [phone]

## 2014-01-20 NOTE — Telephone Encounter (Signed)
Given information about CT scan, will print out [place], patient informed if worsening pain, SOB, CP go to ER.

## 2014-01-26 ENCOUNTER — Telehealth: Payer: Self-pay | Admitting: Internal Medicine

## 2014-01-26 MED ORDER — ALBUTEROL SULFATE HFA 108 (90 BASE) MCG/ACT IN AERS: 2.0000 | INHALATION_SPRAY | Freq: Four times a day (QID) | RESPIRATORY_TRACT | Status: AC | PRN

## 2014-01-26 NOTE — Telephone Encounter (Signed)
I spoke with the pt and he states [employer] will no longer pay for proair but they will cover ventolin. He asks that an rx be sent to [place] for ventolin. Rx sent. Pt is aware. [place] Bing, CMA

## 2014-01-26 NOTE — Telephone Encounter (Signed)
Pt called back & he has his letter. [phone]

## 2014-01-26 NOTE — Telephone Encounter (Signed)
Pt states [employer] sent him a letter stating they will no longer cover Proair.  Pt states he does not have the letter with him to tell us if there are any alternatives.  Pt will  Call us back when he has letter in hand so we can know how to procede.

## 2014-01-28 ENCOUNTER — Encounter: Payer: Self-pay | Admitting: Emergency Medicine

## 2014-01-28 ENCOUNTER — Ambulatory Visit (INDEPENDENT_AMBULATORY_CARE_PROVIDER_SITE_OTHER): Payer: Commercial Managed Care - HMO | Admitting: Emergency Medicine

## 2014-01-28 VITALS — BP 106/62 | HR 104 | Temp 98.6°F | Resp 20 | Ht 69.5 in | Wt 146.0 lb

## 2014-01-28 DIAGNOSIS — R739 Hyperglycemia, unspecified: Secondary | ICD-10-CM

## 2014-01-28 DIAGNOSIS — E559 Vitamin D deficiency, unspecified: Secondary | ICD-10-CM

## 2014-01-28 DIAGNOSIS — R5381 Other malaise: Secondary | ICD-10-CM

## 2014-01-28 DIAGNOSIS — E785 Hyperlipidemia, unspecified: Secondary | ICD-10-CM

## 2014-01-28 DIAGNOSIS — E782 Mixed hyperlipidemia: Secondary | ICD-10-CM

## 2014-01-28 DIAGNOSIS — Z0000 Encounter for general adult medical examination without abnormal findings: Secondary | ICD-10-CM

## 2014-01-28 DIAGNOSIS — R030 Elevated blood-pressure reading, without diagnosis of hypertension: Secondary | ICD-10-CM

## 2014-01-28 DIAGNOSIS — I739 Peripheral vascular disease, unspecified: Secondary | ICD-10-CM

## 2014-01-28 DIAGNOSIS — R5383 Other fatigue: Secondary | ICD-10-CM

## 2014-01-28 DIAGNOSIS — Z125 Encounter for screening for malignant neoplasm of prostate: Secondary | ICD-10-CM

## 2014-01-28 LAB — HEPATIC FUNCTION PANEL
ALK PHOS: 77 U/L (ref 39–117)
ALT: 21 U/L (ref 0–53)
AST: 18 U/L (ref 0–37)
Albumin: 3.8 g/dL (ref 3.5–5.2)
BILIRUBIN INDIRECT: 0.8 mg/dL (ref 0.2–1.2)
Bilirubin, Direct: 0.2 mg/dL (ref 0.0–0.3)
Total Bilirubin: 1 mg/dL (ref 0.2–1.2)
Total Protein: 6.4 g/dL (ref 6.0–8.3)

## 2014-01-28 LAB — BASIC METABOLIC PANEL WITH GFR
BUN: 23 mg/dL (ref 6–23)
CO2: 27 meq/L (ref 19–32)
CREATININE: 1.07 mg/dL (ref 0.50–1.35)
Calcium: 9.6 mg/dL (ref 8.4–10.5)
Chloride: 99 mEq/L (ref 96–112)
GFR, Est African American: 83 mL/min
GFR, Est Non African American: 72 mL/min
Glucose, Bld: 83 mg/dL (ref 70–99)
Potassium: 4.6 mEq/L (ref 3.5–5.3)
Sodium: 137 mEq/L (ref 135–145)

## 2014-01-28 LAB — TSH: TSH: 0.729 u[IU]/mL (ref 0.350–4.500)

## 2014-01-28 LAB — LIPID PANEL
CHOLESTEROL: 171 mg/dL (ref 0–200)
HDL: 86 mg/dL (ref >39)
LDL CALC: 67 mg/dL (ref 0–99)
Total CHOL/HDL Ratio: 2 Ratio
Triglycerides: 91 mg/dL (ref <150)
VLDL: 18 mg/dL (ref 0–40)

## 2014-01-28 LAB — MAGNESIUM: MAGNESIUM: 2.3 mg/dL (ref 1.5–2.5)

## 2014-01-28 NOTE — Progress Notes (Signed)
Subjective:    Patient ID: [name], male    DOB: 04/04/1945, 69 y.o.   MRN: [id number]  HPI Comments: 69 yo WM CPE and presents for 3 month F/U for elevated BP w/o HTN, Cholesterol, Pre-Dm, D. Deficient. He has not been exercising or eating as healthy. He notes BP is good at other visits and has not been on RX for HTN in past. He has been on lipitor 80 mg for cholesterol with increased concerns with PVD by cardiology. He has tolerated increased medicine without concerns.  He continues to smoke QD but has slowly decreased amount. He is not exercising due to his recent increase difficulty with breathing. He had abnormal labs and suspected Pneumonia and has been on DOXYCYCLINE which has helped.   He has needed to wear 2L O2 with activity lately due to the recent lung infection but notes that is improving. He was originally given the oxygen to wear at [facility] by pulmonology but rarely used. He notes he is concerned for becoming dependent on O2 and tries not to wear. He has not followed up AD with Pulmonology with recent concerns. Recent CT CHest showed increased emphysematous changes/ nodule increase and will need repeated in short interval of 3 months with pulmonology.  He is overdue for his colonoscopy by almost 3 years despite yearly reminders from our office. He notes he was not willing to get repeated until GI corrected his billing mistake. He had numerous polyps on last scan. He denies any GI symptoms currently except his chronic upper abdomen/ back pain. He notes pain has improved with recent breathing improvement. He did have CT Chest with upper abdomen imaging recently NEG for abdomen concern. 2012 CT Abdomen WNL.      Medication List       This list is accurate as of: 01/28/14 11:59 PM.  Always use your most recent med list.               albuterol 108 (90 BASE) MCG/ACT inhaler  Commonly known as:  PROVENTIL HFA;VENTOLIN HFA  Inhale 2 puffs into the lungs every 6 (six) hours as needed for  wheezing or shortness of breath.     aspirin 81 MG tablet  Take 81 mg by mouth daily.     atorvastatin 80 MG tablet  Commonly known as:  LIPITOR  Take 1 tablet (80 mg total) by mouth daily.     budesonide-formoterol 160-4.5 MCG/ACT inhaler  Commonly known as:  SYMBICORT  Inhale 2 puffs into the lungs 2 (two) times daily. rinse mouth     COLD AND FLU PO  Take 2 capsules by mouth 2 (two) times daily as needed (for cold).     HYDROcodone-acetaminophen 5-325 MG per tablet  Commonly known as:  NORCO  Take 1 tablet by mouth every 6 (six) hours as needed for moderate pain.     ibuprofen 200 MG tablet  Commonly known as:  ADVIL,MOTRIN  Take 400 mg by mouth every 6 (six) hours as needed for headache or moderate pain.     ipratropium-albuterol 0.5-2.5 (3) MG/3ML Soln  Commonly known as:  DUONEB  Take 3 mLs by nebulization every 4 (four) hours as needed. DX COPD 496     methocarbamol 500 MG tablet  Commonly known as:  ROBAXIN  Take 500 mg by mouth 2 (two) times daily.     theophylline 200 MG 12 hr tablet  Commonly known as:  THEODUR  Take 200 mg by mouth 2 (two)  times daily.     VITAMIN D-3 PO  Take 5,000 Units by mouth daily.       No Known Allergies   Past Medical History  Diagnosis Date  . Epistaxis age 89    after blunt facial trauma. needed surgical clamps and 40 units of blood  . COPD (chronic obstructive pulmonary disease)   . Hyperlipidemia   . DDD (degenerative disc disease)   . Unspecified vitamin D deficiency   . Elevated blood pressure reading without diagnosis of hypertension   . DJD (degenerative joint disease)   . Colon polyps    Past Surgical History  Procedure Laterality Date  . Bilateral knee arthroscopy    . Surgical hemostasis      in sinuses after [place]  . Prostate biopsy    . Back surgery  04-2010    disc replacement   History  Substance Use Topics  . Smoking status: Current Every Day Smoker    Types: Cigarettes  . Smokeless tobacco: Never  Used     Comment: 2 CIGS DAILY  . Alcohol Use: No   Family History  Problem Relation Age of Onset  . Stroke Father   . Hyperlipidemia Father   . Heart disease Father 31    deceased with cerebral aneurysm  . Heart disease Mother   . Lung disease Mother   . Asthma Mother   . Hypertension Mother   . Hyperlipidemia Mother   . Colon cancer Neg Hx   . Diabetes Sister   . Colon polyps Brother   . 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 Echo: EF 50-55% mild dysfunction, 11/2013 ABI: mild build up 11/2013  IMMUNIZATIONS: Tdap:2007 Pneumovax:2010, 2014 Zostavax: Influenza:2015  Patient Care Team: Unk Pinto, MD as PCP - General (Internal Medicine) Inda Castle, MD as Consulting Physician (Gastroenterology) Deneise Lever, MD as Consulting Physician (Pulmonary Disease) [facility] Grant Fontana., MD as Attending Physician (Urology) Lavonna Monarch, MD as Consulting Physician (Dermatology) Sinclair Ship, MD as Consulting Physician (Orthopedic Surgery) Thayer Headings, MD as Consulting Physician (Cardiology)  [facility]   Review of Systems  Respiratory: Positive for shortness of breath.   Cardiovascular: Negative for chest pain.  Gastrointestinal: Positive for abdominal pain.  Musculoskeletal: Positive for back pain.  All other systems reviewed and are negative.  BP 106/62 mmHg  Pulse 104  Temp(Src) 98.6 F (37 C) (Temporal)  Resp 20  Ht 5' 9.5" (1.765 m)  Wt 146 lb (66.225 kg)  BMI 21.26 kg/m2  SpO2 97%     Objective:   Physical Exam  Constitutional: He is oriented to person, place, and time. He appears well-developed.  Thin  HENT:  Head: Atraumatic.  Right Ear: External ear normal.  Left Ear: External ear normal.  Nose: Nose normal.  Mouth/Throat: Oropharynx is clear and moist.  Eyes: Conjunctivae and EOM are  normal. Pupils are equal, round, and reactive to light. No scleral icterus.  Neck: Normal range of motion. Neck supple. No JVD present. No tracheal deviation present. No thyromegaly present.  Cardiovascular: Normal rate, regular rhythm, normal heart sounds and intact distal pulses.   Pulmonary/Chest: Effort normal.  Distant BS, oxygen tank in room but not using.  Abdominal: Soft. Bowel sounds are normal. He exhibits no distension and no mass. There is no tenderness.  Genitourinary:  Def urology  Musculoskeletal: He exhibits tenderness. He exhibits no edema.  Pain with  change of position with back, crepitus bilateral knees  Lymphadenopathy:    He has no cervical adenopathy.  Neurological: He is alert and oriented to person, place, and time. He has normal reflexes. No cranial nerve deficit. Coordination normal.  Skin: Skin is warm and dry. No rash noted. No erythema.  Psychiatric: He has a normal mood and affect. His behavior is normal. Judgment and thought content normal.  Nursing note and vitals reviewed.    AORTA SCAN WNL EKG NSCSPT 1 PVC     Assessment & Plan:  1. CPE- Update screening labs/ History/ Immunizations/ Testing as needed. Advised healthy diet, QD exercise, increase H20 and continue RX/ Vitamins AD.  2. COPD- Needs ASAP evaluation with Dr Annamaria Boots with recent questionable pneumonia, new CT findings and confusion over oxygen use/ directions. Advised needs to slowly increase cardio to help stabilize lung volume and D/C tobacco.Advise repeat CT Chest needed at 02/2014  3. 3 month F/U for  Cholesterol, Pre-Dm, D. Deficient. Needs healthy diet, cardio QD and obtain healthy weight. Check Labs, Check BP if >130/80 call office. Recheck with increased Lipitor dosing. Keep cardiac f/u  4. Abdomen pain- Check labs if NEG and symptoms continue may need to repeat CT Abdomen pelvis. Patient will continue to monitor and call if no change.

## 2014-01-28 NOTE — Patient Instructions (Signed)
Hypoxemia Hypoxemia occurs when your blood does not contain enough oxygen. The body cannot work well when it does not have enough oxygen because every part of your body needs oxygen. Oxygen travels to all parts of the body through your blood. Hypoxemia can develop suddenly or can come on slowly. CAUSES Some common causes of hypoxemia include:  Long-term (chronic) lung diseases, such as chronic obstructive pulmonary disease (COPD) or interstitial lung disease.  Disorders that affect breathing at night, such as sleep apnea.  Fluid buildup in your lungs (pulmonary edema).  Lung infection (pneumonia).  Lung or throat cancer.  Abnormal blood flow that bypasses the lungs (shunt).  Certain diseasesthat affect nerves or muscles.  A collapsed lung (pneumothorax).  A blood clot in the lungs (pulmonary embolus).  Certain types of heart disease.  Slow or shallow breathing (hypoventilation).  Certain medicines.  High altitudes.  Toxic chemicals and gases. SIGNS AND SYMPTOMS Not everyone who has hypoxemia will develop symptoms. If the hypoxemia developed quickly, you will likely have symptoms such as shortness of breath. If the hypoxemia came on slowly over months or years, you may not notice any symptoms. Symptoms can include:  Shortness of breath (dyspnea).  Bluish color of the skin, lips, or nail beds.  Breathing that is fast, noisy, or shallow.  A fast heartbeat.  Feeling tired or sleepy.  Being confused or feeling anxious. DIAGNOSIS To determine if you have hypoxemia, your health care provider may perform:  A physical exam.  Blood tests.  A pulse oximetry. A sensor will be put on your finger, toe, or earlobe to measure the percent of oxygen in your blood. TREATMENT You will likely be treated with oxygen therapy. Depending on the cause of your hypoxemia, you may need oxygen for a short time (weeks or months), or you may need it indefinitely. Your health care provider  may also recommend other therapies to treat the underlying cause of your hypoxemia. HOME CARE INSTRUCTIONS  Only take over-the-counter or prescription medicines as directed by your health care provider.  Follow oxygen safety measures if you are on oxygen therapy. These may include:  Always having a backup supply of oxygen.  Not allowing anyone to smoke around oxygen.  Handling the oxygen tanks carefully and as instructed.  If you smoke, quit. Stay away from people who smoke.  Follow up with your health care provider as directed. SEEK MEDICAL CARE IF:  You have any concerns about your oxygen therapy.  You still have trouble breathing.  You become short of breath when you exercise.  You are tired when you wake up.  You have a headache when you wake up. SEEK IMMEDIATE MEDICAL CARE IF:   Your breathing gets worse.  You have new shortness of breath with normal activity.  You have a bluish color of the skin, lips, or nail beds.  You have confusion or cloudy thinking.  You cough up dark mucus.  You have chest pain.  You have a fever. MAKE SURE YOU:  Understand these instructions.  Will watch your condition.  Will get help right away if you are not doing well or get worse. Document Released: 07/25/2010 Document Revised: 01/14/2013 Document Reviewed: 08/08/2012 Exit[employer] Patient Information 2015 [employer]. This information is not intended to replace advice given to you by your health care provider. Make sure you discuss any questions you have with your health care provider.  

## 2014-01-29 ENCOUNTER — Encounter: Payer: Self-pay | Admitting: Internal Medicine

## 2014-01-29 ENCOUNTER — Ambulatory Visit (INDEPENDENT_AMBULATORY_CARE_PROVIDER_SITE_OTHER): Payer: Commercial Managed Care - HMO | Admitting: Internal Medicine

## 2014-01-29 VITALS — BP 112/80 | HR 96 | Ht 69.0 in | Wt 145.5 lb

## 2014-01-29 DIAGNOSIS — R918 Other nonspecific abnormal finding of lung field: Secondary | ICD-10-CM

## 2014-01-29 DIAGNOSIS — J432 Centrilobular emphysema: Secondary | ICD-10-CM

## 2014-01-29 DIAGNOSIS — Z720 Tobacco use: Secondary | ICD-10-CM

## 2014-01-29 DIAGNOSIS — F17200 Nicotine dependence, unspecified, uncomplicated: Secondary | ICD-10-CM

## 2014-01-29 DIAGNOSIS — R1033 Periumbilical pain: Secondary | ICD-10-CM

## 2014-01-29 LAB — URINALYSIS, ROUTINE W REFLEX MICROSCOPIC
Glucose, UA: NEGATIVE mg/dL
Hgb urine dipstick: NEGATIVE
KETONES UR: NEGATIVE mg/dL
Leukocytes, UA: NEGATIVE
NITRITE: NEGATIVE
PROTEIN: NEGATIVE mg/dL
Specific Gravity, Urine: 1.028 (ref 1.005–1.030)
UROBILINOGEN UA: 0.2 mg/dL (ref 0.0–1.0)
pH: 5 (ref 5.0–8.0)

## 2014-01-29 LAB — CBC WITH DIFFERENTIAL/PLATELET
BASOS PCT: 0 % (ref 0–1)
Basophils Absolute: 0 10*3/uL (ref 0.0–0.1)
Eosinophils Absolute: 0.1 10*3/uL (ref 0.0–0.7)
Eosinophils Relative: 1 % (ref 0–5)
HEMATOCRIT: 42.4 % (ref 39.0–52.0)
HEMOGLOBIN: 14.2 g/dL (ref 13.0–17.0)
Lymphocytes Relative: 15 % (ref 12–46)
Lymphs Abs: 1.9 10*3/uL (ref 0.7–4.0)
MCH: 31.1 pg (ref 26.0–34.0)
MCHC: 33.5 g/dL (ref 30.0–36.0)
MCV: 93 fL (ref 78.0–100.0)
MONO ABS: 1.4 10*3/uL — AB (ref 0.1–1.0)
MPV: 11 fL (ref 8.6–12.4)
Monocytes Relative: 11 % (ref 3–12)
Neutro Abs: 9.1 10*3/uL — ABNORMAL HIGH (ref 1.7–7.7)
Neutrophils Relative %: 73 % (ref 43–77)
Platelets: 200 10*3/uL (ref 150–400)
RBC: 4.56 MIL/uL (ref 4.22–5.81)
RDW: 14.2 % (ref 11.5–15.5)
WBC: 12.5 10*3/uL — ABNORMAL HIGH (ref 4.0–10.5)

## 2014-01-29 LAB — PSA: PSA: 1.48 ng/mL (ref <=4.00)

## 2014-01-29 LAB — MICROALBUMIN / CREATININE URINE RATIO
Creatinine, Urine: 231.7 mg/dL
MICROALB UR: 1.2 mg/dL (ref <2.0)
Microalb Creat Ratio: 5.2 mg/g (ref 0.0–30.0)

## 2014-01-29 LAB — HEMOGLOBIN A1C
Hgb A1c MFr Bld: 6.3 % — ABNORMAL HIGH (ref <5.7)
Mean Plasma Glucose: 134 mg/dL — ABNORMAL HIGH (ref <117)

## 2014-01-29 LAB — INSULIN, FASTING: Insulin fasting, serum: 3.2 u[IU]/mL (ref 2.0–19.6)

## 2014-01-29 LAB — VITAMIN D 25 HYDROXY (VIT D DEFICIENCY, FRACTURES): Vit D, 25-Hydroxy: 34 ng/mL (ref 30–100)

## 2014-01-29 NOTE — Patient Instructions (Addendum)
We have scheduled CT chest in April and will see you back here after that  Please call sooner if needed  It is possible that your abdominal pain is related to bowel ischemia or to your known spine problems.

## 2014-01-29 NOTE — Progress Notes (Signed)
Subjective:    Patient ID: [name], male    DOB: 1945-08-18, 69 y.o.   MRN: [id number]  HPI 06/23/10- 11 yo tobacco user 1/2 ppd, followed for COPD   here with dtr. Last here October 27, 2009. Since then had back surgery -did ok. For last 2-3 weeks had been tighter through chest, difficult to clear thick mucus without fever, green or sore throat. Just feels he can't clear mucus. Has had 2 steroid shots for this this in past month. Taking doxycycline and just finished prednisone. Neb with albuterol opens him only transiently. DOE but comfortable at rest. Denies chest pain. Has had a chest CT at [facility]. CT w/cm 04/01/10- emphysema, NAD, left apical scarring, compression fx's T8, T12 Nuclear bone scan 04/27/10- uptake c/w T8 vertebral compression fx.  07/06/11- 55 yo tobacco user 1/2 ppd, followed for COPD   here with dtr.  Patient states about the same as last visit. c/o  occasional sob and wheezing. Denies chest pain and chest tightness.  He is still smoking 3 packs per week, but considers this improved. We discussed cessation again.  He reports 3 episodes of thrush, using Symbicort. Discussed mouth care and aerochamber. DOE limits stairs and brisk walk. Cough productive clear white. Denies chest pain, blood. Sensitive to weather, cold, humidity.  07/05/12- 82 yo smoker, followed for COPD  FOLLOWS FOR: gives out quickly if trying to do too much; SOB as well. Has to pace himself. Reduced cigarettes to 4 or 5 a day and trying to do better. Breathing limits exercise tolerance. Wheeze, cough, clear sputum. Prescription for prednisone taper from Dr Melford Aase he did not fill. CXR 05/16/12 IMPRESSION:  COPD with stable hyperinflation. No new focal or acute abnormality  identified  Original Report Authenticated By: Ponciano Ort, M.D. PFT 07/02/2009-severe emphysema-FEV1/FVC 0.33, DLCO 71%.  09/04/12- 75 yo smoker, followed for COPD  FOLLOWS FOR: pt denies any concerns at this time-- breathing is doing  well Feeling much better. Likes the theophylline but says it is a tier IV with his insurance. Felt claustrophobic doing PFT. 6MWT 07/23/12- 97%, 94%, 98%, 342 m. No oxygen limitation on this test. PFT- 07/23/12- . Severe obstructive airways disease with insignificant response to bronchodilator. Diffusion severely reduced. FEC 1.75/37%, FEV1 0.64/18%, FEV1/FVC 0.36/48%, FEF 25-75% 38/48% predicted. DLCO 44%.  03/06/13- 41 yo smoker, followed for COPD  FOLLOWS FOR:  Breathing doing well- no concerns today Dislikes the cold weather but no acute problem  07/21/13- 61 yo smoker, followed for COPD  FOLLOWS FOR:  Increased sob, wheezing over the past month  CXR 06/30/13 IMPRESSION:  COPD without acute abnormality.  Electronically Signed  By: Franchot Gallo M.D.  On: 06/30/2013 10:31   09/03/13-67 yo smoker, followed for COPD  FOLLOWS FOR: Pt states he uses his O2 occasionally; could not tell a difference with using O2. Recent CT abd noted 5 mm RLL nodule not mentioned on report from CT chest 2012. Also noted atherosclerosis.   11/03/13 -16 yo smoker, followed for COPD, Lung nodule, complicated by atherosclerosis  FOLLOWS FOR: Gives out quickly when up moving around-recently went on golfing trip and could tell the breathing is off when swinging his club. We had given samples of Anoro and Qvar 80 to compare with Symbicort  01/29/14- 63 yo smoker, followed for COPD Seen in hospital 01/11/14 for PNA, pt reports that SOB and cough is improved. pt states that he was having some abd pain right below rib cage and in lower back - pt states  this comes and goes and is corrected with muscle relaxers and pain pills, and by lying on his stomach. He blames Lipitor for this, not theophylline. Abd CT in October for intermittent pain showed lung nodule and extensive atherosclerosis. Very little cough. No night sweats. Continues oxygen 2 L for sleep. Treated at ER 01/10/2014-pneumonia. Recent physical exam by his primary  physician "okay". Theophylline definitely helps his breathing. He thinks it is well tolerated. CT chest 01/10/14 IMPRESSION: No evidence of pulmonary embolism. Scattered tree-in-bud nodularity with mild ground-glass opacity and bronchial wall thickening in the right middle lobe and bilateral lower lobes, likely reflecting mild bronchopneumonia. Underlying moderate emphysematous changes. No frank interstitial edema. Mild thoracic lymphadenopathy, favored to be reactive. Electronically Signed  By: Julian Hy M.D.  On: 01/10/2014 14:38  ROS-see HPI Constitutional:   No-   weight loss, night sweats, fevers, chills, fatigue, lassitude. HEENT:   No-  headaches, difficulty swallowing, tooth/dental problems, sore throat,       No-  sneezing, itching, ear ache, nasal congestion, post nasal drip,  CV:  No-   chest pain, orthopnea, PND, swelling in lower extremities, anasarca, dizziness, palpitations Resp: + shortness of breath with exertion or at rest.             No- productive cough,  No non-productive cough,  No- coughing up of blood.              No-   change in color of mucus.  No-wheezing.   Skin: No-   rash or lesions. GI:   Intermittent abdominal pain as described GU:  MS:  No-   joint pain or swelling.   Neuro-     nothing unusual Psych:  No- change in mood or affect. No depression or anxiety.  No memory loss.  OBJ- Physical Exam     O2 2L General- Alert, Oriented, Affect-appropriate, Distress- none acute, slender Skin- rash-none, lesions- none, excoriation- none Lymphadenopathy- none Head- atraumatic            Eyes- Gross vision intact, PERRLA, conjunctivae and secretions clear            Ears- Hearing, canals-normal            Nose- clear, no-Septal dev, mucus, polyps, erosion, perforation             Throat- Mallampati II , mucosa clear , drainage- none, tonsils- atrophic Neck- flexible , trachea midline, no stridor , thyroid nl, carotid no bruit Chest -  symmetrical excursion , unlabored           Heart/CV- RRR , no murmur , no gallop  , no rub, nl s1 s2                           - JVD- none , edema- none, stasis changes- none, varices- none           Lung- +distant, wheeze- none, cough- none , dullness-none, rub- none           Chest wall-  Abd- not obviously tender  Br/ Gen/ Rectal- Not done, not indicated Extrem- cyanosis- none, clubbing, none, atrophy- none, strength- nl Neuro- grossly intact to observation

## 2014-01-30 ENCOUNTER — Other Ambulatory Visit: Payer: Self-pay | Admitting: Emergency Medicine

## 2014-01-30 MED ORDER — DOXYCYCLINE HYCLATE 100 MG PO TABS: 100.0000 mg | ORAL_TABLET | Freq: Two times a day (BID) | ORAL | Status: DC

## 2014-02-06 ENCOUNTER — Telehealth: Payer: Self-pay | Admitting: Internal Medicine

## 2014-02-06 ENCOUNTER — Other Ambulatory Visit: Payer: Self-pay | Admitting: Internal Medicine

## 2014-02-06 NOTE — Telephone Encounter (Signed)
Spoke with patient's insurance company-state patient can continue getting Proair HFA up until April 2016; after that he will need a PA or change RX. Pt is aware of this and states he will check with his insurance company and let us know what to change HFA inhaler to as he is aware that the HFA's are pretty much the same just a different company makes them. Nothing more needed at this time.

## 2014-02-12 ENCOUNTER — Ambulatory Visit (INDEPENDENT_AMBULATORY_CARE_PROVIDER_SITE_OTHER): Payer: Self-pay

## 2014-02-12 DIAGNOSIS — R6889 Other general symptoms and signs: Secondary | ICD-10-CM

## 2014-02-12 LAB — CBC WITH DIFFERENTIAL/PLATELET
BASOS ABS: 0.1 10*3/uL (ref 0.0–0.1)
Basophils Relative: 1 % (ref 0–1)
EOS PCT: 9 % — AB (ref 0–5)
Eosinophils Absolute: 0.6 10*3/uL (ref 0.0–0.7)
HCT: 35.9 % — ABNORMAL LOW (ref 39.0–52.0)
Hemoglobin: 12.1 g/dL — ABNORMAL LOW (ref 13.0–17.0)
LYMPHS PCT: 20 % (ref 12–46)
Lymphs Abs: 1.3 10*3/uL (ref 0.7–4.0)
MCH: 30.9 pg (ref 26.0–34.0)
MCHC: 33.7 g/dL (ref 30.0–36.0)
MCV: 91.6 fL (ref 78.0–100.0)
MPV: 11.1 fL (ref 8.6–12.4)
Monocytes Absolute: 0.7 10*3/uL (ref 0.1–1.0)
Monocytes Relative: 11 % (ref 3–12)
Neutro Abs: 3.8 10*3/uL (ref 1.7–7.7)
Neutrophils Relative %: 59 % (ref 43–77)
Platelets: 206 10*3/uL (ref 150–400)
RBC: 3.92 MIL/uL — ABNORMAL LOW (ref 4.22–5.81)
RDW: 13.8 % (ref 11.5–15.5)
WBC: 6.4 10*3/uL (ref 4.0–10.5)

## 2014-02-12 NOTE — Progress Notes (Signed)
Patient ID: [name], male   DOB: 05/27/1945, 69 y.o.   MRN: [id number] Patient here today to recheck CBC.

## 2014-02-14 ENCOUNTER — Other Ambulatory Visit: Payer: Self-pay | Admitting: Internal Medicine

## 2014-02-16 ENCOUNTER — Other Ambulatory Visit: Payer: Self-pay | Admitting: Internal Medicine

## 2014-02-16 ENCOUNTER — Encounter: Payer: Self-pay | Admitting: Internal Medicine

## 2014-02-16 ENCOUNTER — Ambulatory Visit (INDEPENDENT_AMBULATORY_CARE_PROVIDER_SITE_OTHER): Payer: Commercial Managed Care - HMO | Admitting: Emergency Medicine

## 2014-02-16 DIAGNOSIS — D509 Iron deficiency anemia, unspecified: Secondary | ICD-10-CM

## 2014-02-16 LAB — CBC WITH DIFFERENTIAL/PLATELET
Basophils Absolute: 0.1 10*3/uL (ref 0.0–0.1)
Basophils Relative: 1 % (ref 0–1)
EOS ABS: 0.2 10*3/uL (ref 0.0–0.7)
Eosinophils Relative: 3 % (ref 0–5)
HCT: 36.1 % — ABNORMAL LOW (ref 39.0–52.0)
Hemoglobin: 12 g/dL — ABNORMAL LOW (ref 13.0–17.0)
Lymphocytes Relative: 19 % (ref 12–46)
Lymphs Abs: 1.4 10*3/uL (ref 0.7–4.0)
MCH: 30.8 pg (ref 26.0–34.0)
MCHC: 33.2 g/dL (ref 30.0–36.0)
MCV: 92.6 fL (ref 78.0–100.0)
MONOS PCT: 15 % — AB (ref 3–12)
MPV: 10.8 fL (ref 8.6–12.4)
Monocytes Absolute: 1.1 10*3/uL — ABNORMAL HIGH (ref 0.1–1.0)
Neutro Abs: 4.7 10*3/uL (ref 1.7–7.7)
Neutrophils Relative %: 62 % (ref 43–77)
Platelets: 214 10*3/uL (ref 150–400)
RBC: 3.9 MIL/uL — ABNORMAL LOW (ref 4.22–5.81)
RDW: 14.3 % (ref 11.5–15.5)
WBC: 7.5 10*3/uL (ref 4.0–10.5)

## 2014-02-16 LAB — IRON AND TIBC
%SAT: 36 % (ref 20–55)
IRON: 102 ug/dL (ref 42–165)
TIBC: 286 ug/dL (ref 215–435)
UIBC: 184 ug/dL (ref 125–400)

## 2014-02-16 LAB — RETICULOCYTES
ABS RETIC: 66.3 10*3/uL (ref 19.0–186.0)
RBC.: 3.9 MIL/uL — ABNORMAL LOW (ref 4.22–5.81)
Retic Ct Pct: 1.7 % (ref 0.4–2.3)

## 2014-02-16 NOTE — Patient Instructions (Signed)
Anemia, Nonspecific Anemia is a condition in which the concentration of red blood cells or hemoglobin in the blood is below normal. Hemoglobin is a substance in red blood cells that carries oxygen to the tissues of the body. Anemia results in not enough oxygen reaching these tissues.  CAUSES  Common causes of anemia include:   Excessive bleeding. Bleeding may be internal or external. This includes excessive bleeding from periods (in women) or from the intestine.   Poor nutrition.   Chronic kidney, thyroid, and liver disease.  Bone marrow disorders that decrease red blood cell production.  Cancer and treatments for cancer.  HIV, AIDS, and their treatments.  Spleen problems that increase red blood cell destruction.  Blood disorders.  Excess destruction of red blood cells due to infection, medicines, and autoimmune disorders. SIGNS AND SYMPTOMS   Minor weakness.   Dizziness.   Headache.  Palpitations.   Shortness of breath, especially with exercise.   Paleness.  Cold sensitivity.  Indigestion.  Nausea.  Difficulty sleeping.  Difficulty concentrating. Symptoms may occur suddenly or they may develop slowly.  DIAGNOSIS  Additional blood tests are often needed. These help your health care provider determine the best treatment. Your health care provider will check your stool for blood and look for other causes of blood loss.  TREATMENT  Treatment varies depending on the cause of the anemia. Treatment can include:   Supplements of iron, vitamin B12, or folic acid.   Hormone medicines.   A blood transfusion. This may be needed if blood loss is severe.   Hospitalization. This may be needed if there is significant continual blood loss.   Dietary changes.  Spleen removal. HOME CARE INSTRUCTIONS Keep all follow-up appointments. It often takes many weeks to correct anemia, and having your health care provider check on your condition and your response to  treatment is very important. SEEK IMMEDIATE MEDICAL CARE IF:   You develop extreme weakness, shortness of breath, or chest pain.   You become dizzy or have trouble concentrating.  You develop heavy vaginal bleeding.   You develop a rash.   You have bloody or black, tarry stools.   You faint.   You vomit up blood.   You vomit repeatedly.   You have abdominal pain.  You have a fever or persistent symptoms for more than 2-3 days.   You have a fever and your symptoms suddenly get worse.   You are dehydrated.  MAKE SURE YOU:  Understand these instructions.  Will watch your condition.  Will get help right away if you are not doing well or get worse. Document Released: 02/17/2004 Document Revised: 09/11/2012 Document Reviewed: 07/05/2012 [employer] Patient Information 2015 [employer]. This information is not intended to replace advice given to you by your health care provider. Make sure you discuss any questions you have with your health care provider.  

## 2014-02-16 NOTE — Progress Notes (Signed)
Subjective:    Patient ID: [name], male    DOB: 1945/03/28, 69 y.o.   MRN: [id number]  HPI Comments: 69 yo WM COPD patient with recent breathing flares. He did f/u with DR young , Pulmonology AD but did not ask him about need for oxygen or if needed CT Chest repeated earlier than 04/2014. CT Chest had new lung nodules and lymphadenopathy. He was treated with 2 rounds of Doxycylcine for ? Bronchopneumonia and elevated WBC. He notes breathing has improved over last few days and has been able to decrease oxygen usage.  Wife missed several days of work due to his increased pain and exacerbations of breathing symptoms. 1/5, 1/6, 1/22 and will need FMLA paper work filled out. Wife notes when he has flares he is unable to take care of himself or ADL's due to fatigue/ pain/ SOB.  He notes epigastric pain has improved with treatment of pneumonia. He has OVERDUE colonoscopy scheduled this week. He was noted to be mildly anemic on recheck of CBC and will have repeat labs today for stability. He does not check stool for blood. He notes fatigue only when breathing or pain flares.    Abdominal Pain Pertinent negatives include no fever.     Medication List       This list is accurate as of: 02/16/14 11:59 PM.  Always use your most recent med list.               albuterol 108 (90 BASE) MCG/ACT inhaler  Commonly known as:  PROVENTIL HFA;VENTOLIN HFA  Inhale 2 puffs into the lungs every 6 (six) hours as needed for wheezing or shortness of breath.     aspirin 81 MG tablet  Take 81 mg by mouth daily.     atorvastatin 80 MG tablet  Commonly known as:  LIPITOR  Take 1 tablet (80 mg total) by mouth daily.     budesonide-formoterol 160-4.5 MCG/ACT inhaler  Commonly known as:  SYMBICORT  Inhale 2 puffs into the lungs 2 (two) times daily. rinse mouth     HYDROcodone-acetaminophen 5-325 MG per tablet  Commonly known as:  NORCO  Take 1 tablet by mouth every 6 (six) hours as needed for moderate pain.      ibuprofen 200 MG tablet  Commonly known as:  ADVIL,MOTRIN  Take 400 mg by mouth every 6 (six) hours as needed for headache or moderate pain.     ipratropium-albuterol 0.5-2.5 (3) MG/3ML Soln  Commonly known as:  DUONEB  Take 3 mLs by nebulization every 4 (four) hours as needed. DX COPD 496     methocarbamol 500 MG tablet  Commonly known as:  ROBAXIN  Take 500 mg by mouth 2 (two) times daily.     theophylline 200 MG 12 hr tablet  Commonly known as:  THEODUR  TAKE ONE TABLET BY MOUTH TWICE DAILY     VITAMIN D-3 PO  Take 5,000 Units by mouth daily.       No Known Allergies   Past Medical History  Diagnosis Date  . Epistaxis age 43    after blunt facial trauma. needed surgical clamps and 40 units of blood  . COPD (chronic obstructive pulmonary disease)   . Hyperlipidemia   . DDD (degenerative disc disease)   . Unspecified vitamin D deficiency   . Elevated blood pressure reading without diagnosis of hypertension   . DJD (degenerative joint disease)   . Colon polyps       Review of  Systems  Constitutional: Negative for fever.  Respiratory: Positive for shortness of breath.   Gastrointestinal: Positive for abdominal pain.  All other systems reviewed and are negative.  BP 110/62 mmHg  Pulse 100  Temp(Src) 98.1 F (36.7 C)  Resp 20  Ht 5\' 10"  (1.778 m)  Wt 145 lb (65.772 kg)  BMI 20.81 kg/m2     Objective:   Physical Exam  Constitutional: He is oriented to person, place, and time. He appears well-developed and well-nourished.  HENT:  Head: Normocephalic and atraumatic.  Nose: Nose normal.  Mouth/Throat: Oropharynx is clear and moist. No oropharyngeal exudate.  Eyes: Conjunctivae are normal.  Neck: Normal range of motion.  Cardiovascular: Normal rate, regular rhythm, normal heart sounds and intact distal pulses.   Pulmonary/Chest: Effort normal.  Distant BS with improvement since last visit.  Abdominal: Soft.  Musculoskeletal: Normal range of motion.   Lymphadenopathy:    He has no cervical adenopathy.  Neurological: He is alert and oriented to person, place, and time.  Skin: Skin is warm and dry. No rash noted.  Psychiatric: He has a normal mood and affect. Judgment normal.  Nursing note and vitals reviewed.         Assessment & Plan:  1. Epigastric Abdomen pain- ? Related to colon polyps vs T8 surgical history vs recent pneumonia- Advised keep GI appointment for colonoscopy this week. With symptoms improving with pneumonia improving possibly cause but need to continue to monitor with NEG CT ABD 10/28/13  2. New DX of anemia- Recheck labs, advised could be in relation to recent antibiotics/ illness vs polyp hx. Keep colonoscopy appointment this week.   3. NON-COMPLIANT COPD/ atherosclerosis- Patient advised again needs to d/c tobacco and needs to walk QD to help with blood vessels and lung capacity. Advised needs to take Lipitor AD per cardiologist with vessel concerns. Patient is compliant with medications only.  4. Lung nodules/ Lymphadenopathy- Patient has repeat CT Chest w/o contrast scheduled 04/2014, Advised again to discuss with DR Annamaria Boots if he felt need to repeat scan at earlier date. Reviewed Dr Janee Morn note which stated scan scheduled 04/2014. Advise patient to monitor for any changes with fatigue/ weight/ lymphadenopathy especially in neck/ axilla and to call for recheck if any present.  OVER 40 minutes of exam, counseling, chart review with patient and discussion with Dr. Melford Aase. Advised work note for wife and will fill out FMLA papers for wife once received.

## 2014-02-17 ENCOUNTER — Encounter: Payer: Self-pay | Admitting: Emergency Medicine

## 2014-02-18 ENCOUNTER — Ambulatory Visit (AMBULATORY_SURGERY_CENTER): Payer: Commercial Managed Care - HMO | Admitting: Gastroenterology

## 2014-02-18 ENCOUNTER — Encounter: Payer: Self-pay | Admitting: Gastroenterology

## 2014-02-18 ENCOUNTER — Telehealth: Payer: Self-pay | Admitting: *Deleted

## 2014-02-18 VITALS — BP 120/67 | HR 85 | Temp 97.8°F | Resp 16 | Ht 70.0 in | Wt 145.0 lb

## 2014-02-18 DIAGNOSIS — D122 Benign neoplasm of ascending colon: Secondary | ICD-10-CM

## 2014-02-18 DIAGNOSIS — R1033 Periumbilical pain: Secondary | ICD-10-CM | POA: Diagnosis present

## 2014-02-18 DIAGNOSIS — D123 Benign neoplasm of transverse colon: Secondary | ICD-10-CM

## 2014-02-18 MED ORDER — SODIUM CHLORIDE 0.9 % IV SOLN: 500.0000 mL | INTRAVENOUS | Status: DC

## 2014-02-18 NOTE — Progress Notes (Signed)
A/ox3, pleased with MAC, report to RN 

## 2014-02-18 NOTE — Op Note (Signed)
[place]  [address] [place], [id number]   COLONOSCOPY PROCEDURE REPORT  PATIENT: [name], [name]  MR#: [id number] BIRTHDATE: 1945-08-29 , 70  yrs. old GENDER: male ENDOSCOPIST: Inda Castle, MD REFERRED OI:[id number] Melford Aase, M.D. PROCEDURE DATE:  02/18/2014 PROCEDURE:   Colonoscopy with snare polypectomy and Colonoscopy with cold biopsy polypectomy First Screening Colonoscopy - Avg.  risk and is 50 yrs.  old or older - No.  Prior Negative Screening - Now for repeat screening. 10 or more years since last screening  History of Adenoma - Now for follow-up colonoscopy & has been > or = to 3 yrs.  N/A  Polyps Removed Today? Yes. ASA CLASS:   Class II INDICATIONS:average risk for colon cancer. MEDICATIONS: Monitored anesthesia care and Propofol 250 mg IV  DESCRIPTION OF PROCEDURE:   After the risks benefits and alternatives of the procedure were thoroughly explained, informed consent was obtained.  The digital rectal exam revealed no abnormalities of the rectum.   The LB [id number]  endoscope was introduced through the anus and advanced to the cecum, which was identified by both the appendix and ileocecal valve. No adverse events experienced.   The quality of the prep was Suprep good  The instrument was then slowly withdrawn as the colon was fully examined.      COLON FINDINGS: Three flat polyps ranging from 3 to 10mm in size were found in the ascending colon.  A polypectomy was performed with a cold snare.  The resection was complete, the polyp tissue was completely retrieved and sent to histology.   Seven sessile polyps ranging from 2 to 50mm in size were found at the hepatic flexure. Polypectomies were performed with cold forceps and with a cold snare.  The resection was complete, the polyp tissue was completely retrieved and sent to histology.   Two sessile polyps measuring 2 mm in size were found in the proximal transverse colon.  A polypectomy was  performed with cold forceps.   A sessile polyp measuring 5 mm in size was found in the distal transverse colon.  A polypectomy was performed with a cold snare.  The resection was complete, the polyp tissue was completely retrieved and sent to histology.  Retroflexed views revealed no abnormalities. The time to cecum=4 minutes 44 seconds.  Withdrawal time=20 minutes 30 seconds.  The scope was withdrawn and the procedure completed. COMPLICATIONS: There were no immediate complications.  ENDOSCOPIC IMPRESSION: colonic polyposis  RECOMMENDATIONS: Colonoscopy one year Hold NSAIDs for 2 weeks  eSigned:  Inda Castle, MD 02/18/2014 10:58 AM   cc:   PATIENT NAME:  [name], [name] MR#: [id number]

## 2014-02-18 NOTE — Progress Notes (Signed)
Doctor and staff made aware that pt. Use oxygen at home prior to procedure.

## 2014-02-18 NOTE — Telephone Encounter (Signed)
-----   Message from Ardis Hughs, [place] sent at 02/17/2014  3:21 PM EST ----- Regarding: pls PLease call and let them know Dr Melford Aase and I spoke at length about him last night and reviewed DR Young's note which suggests keeping CT scheduled 04/2014. SO please call and tell them : Patient has repeat CT Chest w/o contrast scheduled 04/2014, Advised again to discuss with DR Annamaria Boots if he felt need to repeat scan at earlier date. Reviewed Dr Janee Morn note which stated scan scheduled 04/2014. Advise patient to monitor for any changes with fatigue/ weight/ lymphadenopathy especially in neck/ axilla and to call for recheck if any present.

## 2014-02-18 NOTE — Progress Notes (Signed)
Called to room to assist during endoscopic procedure.  Patient ID and intended procedure confirmed with present staff. Received instructions for my participation in the procedure from the performing physician.  

## 2014-02-18 NOTE — Patient Instructions (Signed)

## 2014-02-18 NOTE — Telephone Encounter (Signed)
Patient aware.  Advised him to call Dr. Annamaria Boots for any further questions, concerns or symptoms.

## 2014-02-19 ENCOUNTER — Telehealth: Payer: Self-pay | Admitting: Internal Medicine

## 2014-02-19 ENCOUNTER — Telehealth: Payer: Self-pay | Admitting: *Deleted

## 2014-02-19 NOTE — Telephone Encounter (Signed)
Pt can be seen Monday 02-23-14 at 11:15am.

## 2014-02-19 NOTE — Telephone Encounter (Signed)
Called and appt scheduled for Monday. Nothing further needed

## 2014-02-19 NOTE — Telephone Encounter (Signed)
He just had a CT chest a month ago, with nothing seen that would easily explain pain, although we do want to follow up. Before we order more tests, like perhaps CT angio of the abdomen, I would like to see him for ov.

## 2014-02-19 NOTE — Telephone Encounter (Signed)
  Follow up Call-  Call back number 02/18/2014 12/18/2011  Post procedure Call Back phone  # [phone]  Permission to leave phone message Yes Yes     Patient questions:  Do you have a fever, pain , or abdominal swelling? No. Pain Score  0 *  Have you tolerated food without any problems? Yes.    Have you been able to return to your normal activities? Yes.    Do you have any questions about your discharge instructions: Diet   No. Medications  No. Follow up visit  No.  Do you have questions or concerns about your Care? No.  Actions: * If pain score is 4 or above: No action needed, pain <4.  Pt c/o his asthma bothering him last night, pt states it is better this am but it was really bad after the procedure-adm

## 2014-02-19 NOTE — Telephone Encounter (Signed)
Spoke with pt's daughter.  She states that pt continues to have same abdominal pain that results in increased diff breathing.  He has seen PCP and also had colonoscopy with multiple polyps removed.  PCP is deferring this to Dr Annamaria Boots thinking that he needs to have repeat chest ct sooner that April.  They want to know where to go from here because pt is not any better.  Please advise.

## 2014-02-20 ENCOUNTER — Emergency Department (HOSPITAL_COMMUNITY): Payer: Commercial Managed Care - HMO

## 2014-02-20 ENCOUNTER — Encounter (HOSPITAL_COMMUNITY): Payer: Self-pay | Admitting: *Deleted

## 2014-02-20 ENCOUNTER — Emergency Department (HOSPITAL_COMMUNITY)
Admission: EM | Admit: 2014-02-20 | Discharge: 2014-02-20 | Disposition: A | Discharge status: Home / Self Care | Payer: Commercial Managed Care - HMO | Attending: Emergency Medicine | Admitting: Emergency Medicine

## 2014-02-20 DIAGNOSIS — R109 Unspecified abdominal pain: Secondary | ICD-10-CM | POA: Diagnosis present

## 2014-02-20 DIAGNOSIS — E559 Vitamin D deficiency, unspecified: Secondary | ICD-10-CM | POA: Diagnosis not present

## 2014-02-20 DIAGNOSIS — Z7951 Long term (current) use of inhaled steroids: Secondary | ICD-10-CM | POA: Insufficient documentation

## 2014-02-20 DIAGNOSIS — Z79899 Other long term (current) drug therapy: Secondary | ICD-10-CM | POA: Insufficient documentation

## 2014-02-20 DIAGNOSIS — J449 Chronic obstructive pulmonary disease, unspecified: Secondary | ICD-10-CM | POA: Insufficient documentation

## 2014-02-20 DIAGNOSIS — E785 Hyperlipidemia, unspecified: Secondary | ICD-10-CM | POA: Diagnosis not present

## 2014-02-20 DIAGNOSIS — M546 Pain in thoracic spine: Secondary | ICD-10-CM | POA: Diagnosis not present

## 2014-02-20 DIAGNOSIS — Z792 Long term (current) use of antibiotics: Secondary | ICD-10-CM | POA: Insufficient documentation

## 2014-02-20 DIAGNOSIS — Z86010 Personal history of colonic polyps: Secondary | ICD-10-CM | POA: Insufficient documentation

## 2014-02-20 DIAGNOSIS — Z7982 Long term (current) use of aspirin: Secondary | ICD-10-CM | POA: Insufficient documentation

## 2014-02-20 DIAGNOSIS — Z720 Tobacco use: Secondary | ICD-10-CM | POA: Insufficient documentation

## 2014-02-20 LAB — CBC WITH DIFFERENTIAL/PLATELET
BASOS PCT: 1 % (ref 0–1)
Basophils Absolute: 0.1 10*3/uL (ref 0.0–0.1)
EOS ABS: 0.4 10*3/uL (ref 0.0–0.7)
Eosinophils Relative: 5 % (ref 0–5)
HEMATOCRIT: 35.1 % — AB (ref 39.0–52.0)
Hemoglobin: 11.6 g/dL — ABNORMAL LOW (ref 13.0–17.0)
Lymphocytes Relative: 19 % (ref 12–46)
Lymphs Abs: 1.6 10*3/uL (ref 0.7–4.0)
MCH: 31.1 pg (ref 26.0–34.0)
MCHC: 33 g/dL (ref 30.0–36.0)
MCV: 94.1 fL (ref 78.0–100.0)
Monocytes Absolute: 1 10*3/uL (ref 0.1–1.0)
Monocytes Relative: 12 % (ref 3–12)
NEUTROS PCT: 63 % (ref 43–77)
Neutro Abs: 5.3 10*3/uL (ref 1.7–7.7)
PLATELETS: 175 10*3/uL (ref 150–400)
RBC: 3.73 MIL/uL — ABNORMAL LOW (ref 4.22–5.81)
RDW: 13.7 % (ref 11.5–15.5)
WBC: 8.4 10*3/uL (ref 4.0–10.5)

## 2014-02-20 LAB — COMPREHENSIVE METABOLIC PANEL
ALK PHOS: 83 U/L (ref 39–117)
ALT: 18 U/L (ref 0–53)
ANION GAP: 6 (ref 5–15)
AST: 24 U/L (ref 0–37)
Albumin: 3.8 g/dL (ref 3.5–5.2)
BUN: 9 mg/dL (ref 6–23)
CO2: 28 mmol/L (ref 19–32)
CREATININE: 1.05 mg/dL (ref 0.50–1.35)
Calcium: 8.9 mg/dL (ref 8.4–10.5)
Chloride: 106 mmol/L (ref 96–112)
GFR, EST AFRICAN AMERICAN: 83 mL/min — AB (ref >90)
GFR, EST NON AFRICAN AMERICAN: 72 mL/min — AB (ref >90)
Glucose, Bld: 108 mg/dL — ABNORMAL HIGH (ref 70–99)
Potassium: 3.3 mmol/L — ABNORMAL LOW (ref 3.5–5.1)
SODIUM: 140 mmol/L (ref 135–145)
Total Bilirubin: 0.8 mg/dL (ref 0.3–1.2)
Total Protein: 6.4 g/dL (ref 6.0–8.3)

## 2014-02-20 LAB — URINALYSIS, ROUTINE W REFLEX MICROSCOPIC
Bilirubin Urine: NEGATIVE
Glucose, UA: NEGATIVE mg/dL
Hgb urine dipstick: NEGATIVE
Ketones, ur: NEGATIVE mg/dL
Leukocytes, UA: NEGATIVE
Nitrite: NEGATIVE
Protein, ur: NEGATIVE mg/dL
Specific Gravity, Urine: 1.016 (ref 1.005–1.030)
Urobilinogen, UA: 0.2 mg/dL (ref 0.0–1.0)
pH: 6 (ref 5.0–8.0)

## 2014-02-20 LAB — LIPASE, BLOOD: Lipase: 43 U/L (ref 11–59)

## 2014-02-20 MED ORDER — OXYCODONE-ACETAMINOPHEN 5-325 MG PO TABS: 1.0000 | ORAL_TABLET | ORAL | Status: DC | PRN

## 2014-02-20 MED ORDER — FENTANYL CITRATE 0.05 MG/ML IJ SOLN
50.0000 ug | Freq: Once | INTRAMUSCULAR | Status: AC
  Administered 2014-02-20: 50 ug via INTRAVENOUS
  Filled 2014-02-20: qty 2

## 2014-02-20 MED ORDER — ONDANSETRON HCL 4 MG/2ML IJ SOLN
4.0000 mg | Freq: Once | INTRAMUSCULAR | Status: AC
  Administered 2014-02-20: 4 mg via INTRAVENOUS
  Filled 2014-02-20: qty 2

## 2014-02-20 MED ORDER — OXYCODONE-ACETAMINOPHEN 5-325 MG PO TABS
1.0000 | ORAL_TABLET | Freq: Once | ORAL | Status: AC
Start: 2014-02-20 — End: 2014-02-20
  Administered 2014-02-20: 1 via ORAL
  Filled 2014-02-20: qty 1

## 2014-02-20 NOTE — ED Provider Notes (Signed)
CSN: [id number]     Arrival date & time 02/20/14  1408 History   First MD Initiated Contact with Patient 02/20/14 1802     Chief Complaint  Patient presents with  . Abdominal Pain     (Consider location/radiation/quality/duration/timing/severity/associated sxs/prior Treatment) HPI [name] is a 69 y.o. male with hx of COPD, asthma, DJD, DDD, presents to ED with complaint of mid back and upper abdominal pain. Pt states pain has gradually progressed since October, 3 months ago. Pt has been seen by his PCP, has had labs done, xrays, CT of the abd/pelvis initially when pain started, CT angio chest, CT of lumbar spine, ECHO, and colonoscopy all with no explanation for his pain. Pt also saw his spine surgeon, Dr. Lynann Bologna, actually saw his PA, who did xrays of the back and told him they are unchanged. Pt states pain is both in the back and upper abdomen. States pain worsened with movement. Improved with laying on stomach. Denies fever, chills, urinary symptoms, nausea, vomiting, changes in his bowels. Has been taking norco, but states it is not helping. Pt deneis naumbnes or weakness in extremities.   Past Medical History  Diagnosis Date  . Epistaxis age 54    after blunt facial trauma. needed surgical clamps and 40 units of blood  . COPD (chronic obstructive pulmonary disease)   . Hyperlipidemia   . DDD (degenerative disc disease)   . Unspecified vitamin D deficiency   . Elevated blood pressure reading without diagnosis of hypertension   . DJD (degenerative joint disease)   . Colon polyps   . Asthma   . Osteoporosis   . Oxygen deficiency     2 LITERS, STARTED 3-4 MONTHS AGO   Past Surgical History  Procedure Laterality Date  . Bilateral knee arthroscopy    . Surgical hemostasis      in sinuses after [place]  . Prostate biopsy    . Back surgery  04-2010    disc replacement  . Colonoscopy     Family History  Problem Relation Age of Onset  . Stroke Father   . Hyperlipidemia Father    . Heart disease Father 24    deceased with cerebral aneurysm  . Heart disease Mother   . Lung disease Mother   . Asthma Mother   . Hypertension Mother   . Hyperlipidemia Mother   . Colon cancer Neg Hx   . Diabetes Sister   . Colon polyps Brother   . Leukemia Sister    History  Substance Use Topics  . Smoking status: Current Every Day Smoker    Types: Cigarettes  . Smokeless tobacco: Never Used     Comment: 2 CIGS DAILY  . Alcohol Use: No    Review of Systems  Constitutional: Negative for fever and chills.  Respiratory: Negative for cough, chest tightness and shortness of breath.   Cardiovascular: Negative for chest pain, palpitations and leg swelling.  Gastrointestinal: Positive for abdominal pain. Negative for nausea, vomiting, diarrhea and abdominal distention.  Genitourinary: Negative for dysuria, urgency, frequency, hematuria and difficulty urinating.  Musculoskeletal: Positive for back pain. Negative for myalgias, neck pain and neck stiffness.  Skin: Negative for rash.  Allergic/Immunologic: Negative for immunocompromised state.  Neurological: Negative for dizziness, weakness, light-headedness, numbness and headaches.  All other systems reviewed and are negative.     Allergies  Review of patient's allergies indicates no known allergies.  Home Medications   Prior to Admission medications   Medication Sig Start Date End  Date Taking? Authorizing Provider  albuterol (PROVENTIL HFA;VENTOLIN HFA) 108 (90 BASE) MCG/ACT inhaler Inhale 2 puffs into the lungs every 6 (six) hours as needed for wheezing or shortness of breath. 01/26/14  Yes Deneise Lever, MD  aspirin 81 MG tablet Take 81 mg by mouth daily.   Yes Historical Provider, MD  atorvastatin (LIPITOR) 40 MG tablet Take 40 mg by mouth 2 (two) times daily.   Yes Historical Provider, MD  budesonide-formoterol (SYMBICORT) 160-4.5 MCG/ACT inhaler Inhale 2 puffs into the lungs 2 (two) times daily. rinse mouth 08/28/13 08/28/14  Yes Melissa R Smith, PA-C  Cholecalciferol (VITAMIN D-3 PO) Take 5,000 Units by mouth daily.   Yes Historical Provider, MD  HYDROcodone-acetaminophen (NORCO) 5-325 MG per tablet Take 1 tablet by mouth every 6 (six) hours as needed for moderate pain. 01/20/14  Yes Vicie Mutters, PA-C  ibuprofen (ADVIL,MOTRIN) 200 MG tablet Take 400 mg by mouth every 6 (six) hours as needed for headache or moderate pain.    Yes Historical Provider, MD  ipratropium-albuterol (DUONEB) 0.5-2.5 (3) MG/3ML SOLN Take 3 mLs by nebulization every 4 (four) hours as needed. DX COPD 496 03/06/13 04/18/14 Yes Clinton D Young, MD  methocarbamol (ROBAXIN) 500 MG tablet Take 500 mg by mouth 2 (two) times daily as needed for muscle spasms.    Yes Historical Provider, MD  theophylline (THEODUR) 200 MG 12 hr tablet TAKE ONE TABLET BY MOUTH TWICE DAILY 02/06/14  Yes Deneise Lever, MD  atorvastatin (LIPITOR) 80 MG tablet Take 1 tablet (80 mg total) by mouth daily. Patient not taking: Reported on 02/20/2014 12/10/13   Thayer Headings, MD  doxycycline (VIBRA-TABS) 100 MG tablet Take 1 tablet by mouth 2 (two) times daily. For 10 days 01/30/14   Historical Provider, MD   BP 116/72 mmHg  Pulse 100  Temp(Src) 98 F (36.7 C) (Oral)  Resp 16  SpO2 96% Physical Exam  Constitutional: He is oriented to person, place, and time. He appears well-developed and well-nourished. No distress.  HENT:  Head: Normocephalic and atraumatic.  Eyes: Conjunctivae are normal.  Neck: Neck supple.  Cardiovascular: Normal rate, regular rhythm and normal heart sounds.   Pulmonary/Chest: Effort normal. No respiratory distress. He has no wheezes. He has no rales.  Abdominal: Soft. Bowel sounds are normal. He exhibits no distension. There is no tenderness. There is no rebound and no guarding.  No CVA tenderness  Musculoskeletal: He exhibits no edema or tenderness.  No lumbar or thoracic midline or perivertebral tenderness  Neurological: He is alert and oriented  to person, place, and time.  Skin: Skin is warm and dry.  Nursing note and vitals reviewed.   ED Course  Procedures (including critical care time) Labs Review Labs Reviewed  CBC WITH DIFFERENTIAL/PLATELET - Abnormal; Notable for the following:    RBC 3.73 (*)    Hemoglobin 11.6 (*)    HCT 35.1 (*)    All other components within normal limits  COMPREHENSIVE METABOLIC PANEL - Abnormal; Notable for the following:    Potassium 3.3 (*)    Glucose, Bld 108 (*)    GFR calc non Af Amer 72 (*)    GFR calc Af Amer 83 (*)    All other components within normal limits  URINE CULTURE  LIPASE, BLOOD  URINALYSIS, ROUTINE W REFLEX MICROSCOPIC    Imaging Review Ct Thoracic Spine Wo Contrast  02/20/2014   CLINICAL DATA:  Abdominal pain  EXAM: CT THORACIC SPINE WITHOUT CONTRAST  TECHNIQUE: Multidetector CT  imaging of the thoracic spine was performed without intravenous contrast administration. Multiplanar CT image reconstructions were also generated.  COMPARISON:  CT chest 04/01/2010, 01/10/2014  FINDINGS: There is a chronic T8, T11 and T12 vertebral body compression fracture which are unchanged compared with 01/10/2014. There is evidence of prior augmentation at T8 with methylmethacrylate within the T8 vertebral body.  The remainder of the vertebral body heights are maintained. There is generalized osteopenia.  The alignment is anatomic. There is no static listhesis. The paravertebral soft tissues are normal. The intraspinal soft tissues are not fully imaged on this examination due to poor soft tissue contrast, but there is no gross soft tissue abnormality. The disc spaces are maintained.  There is thoracic aortic atherosclerosis. There is bilateral centrilobular emphysema. There is left apical scarring. There is a 4 mm left lower lobe pulmonary nodule.  IMPRESSION: IMPRESSION 1. No acute osseous injury thoracic spine. 2. 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 3. 4 mm left lower lobe  pulmonary nodule. If the patient is at high risk for bronchogenic carcinoma, follow-up chest CT at 1 year is recommended. If the patient is at low risk, no follow-up is needed. This recommendation follows the consensus statement: Guidelines for Management of Small Pulmonary Nodules Detected on CT Scans: A Statement from the [employer] as published in Radiology 2005; 237:395-400.   Electronically Signed   By: Kathreen Devoid   On: 02/20/2014 20:35     EKG Interpretation None      MDM   Final diagnoses:  Abdominal pain, unspecified abdominal location  Thoracic back pain, unspecified back pain laterality    Patient with bilateral flank and upper abdominal pain, no tenderness on exam. No nausea, vomiting. No changes in bowels. Has had multiple CTs and colonoscopy in evaluation of this pain. Pain has been there for 3 months. Patient has history of T8 surgery by Dr. Lynann Bologna, based on patient's history and exam, I wonder if patient's pain is radicular from thoracic spine problem. We'll get labs and CT of thoracic spine.  Pt's labs unremarkable. CT with no acute findings. Based on pt's hx and exam, as well as all prior tests, question if his pain is radiculopathy from thoracic spine. Will d/c home with percocet and close follow up with his doctor.   Filed Vitals:   02/20/14 2030 02/20/14 2100 02/20/14 2122 02/20/14 2154  BP:   115/75 132/84  Pulse: 83 84 95 89  Temp:   98.5 F (36.9 C) 98 F (36.7 C)  TempSrc:   Oral Oral  Resp:   20 18  Height:      Weight:      SpO2: 97% 96% 98% 97%     Renold Genta, PA-C 02/21/14 0050  Leota Jacobsen, MD 02/22/14 1725

## 2014-02-20 NOTE — ED Provider Notes (Signed)
Medical screening examination/treatment/procedure(s) were conducted as a shared visit with non-physician practitioner(s) and myself.  I personally evaluated the patient during the encounter.   EKG Interpretation None     Patient here with pain in his back radiating to his abdomen 2 months. Has had multiple workups for similar symptoms. Had a CT of his thoracic spine today to rule out impingement which came back negative for new injury. Patient will be instructed to follow-up about his pulmonary nodule.  Leota Jacobsen, MD 02/20/14 2117

## 2014-02-20 NOTE — Discharge Instructions (Signed)
Percocet for pain. I strongly suspect your symptoms are coming from your thoracic spine. Please follow up with your doctor for re evaluation. Your blood work here is otherwise normal. Your ct today again showed a lung nodule, follow up with pulmonology as scheduled.     Abdominal Pain Many things can cause abdominal pain. Usually, abdominal pain is not caused by a disease and will improve without treatment. It can often be observed and treated at home. Your health care provider will do a physical exam and possibly order blood tests and X-rays to help determine the seriousness of your pain. However, in many cases, more time must pass before a clear cause of the pain can be found. Before that point, your health care provider may not know if you need more testing or further treatment. HOME CARE INSTRUCTIONS  Monitor your abdominal pain for any changes. The following actions may help to alleviate any discomfort you are experiencing:  Only take over-the-counter or prescription medicines as directed by your health care provider.  Do not take laxatives unless directed to do so by your health care provider.  Try a clear liquid diet (broth, tea, or water) as directed by your health care provider. Slowly move to a bland diet as tolerated. SEEK MEDICAL CARE IF:  You have unexplained abdominal pain.  You have abdominal pain associated with nausea or diarrhea.  You have pain when you urinate or have a bowel movement.  You experience abdominal pain that wakes you in the night.  You have abdominal pain that is worsened or improved by eating food.  You have abdominal pain that is worsened with eating fatty foods.  You have a fever. SEEK IMMEDIATE MEDICAL CARE IF:   Your pain does not go away within 2 hours.  You keep throwing up (vomiting).  Your pain is felt only in portions of the abdomen, such as the right side or the left lower portion of the abdomen.  You pass bloody or black tarry  stools. MAKE SURE YOU:  Understand these instructions.   Will watch your condition.   Will get help right away if you are not doing well or get worse.  Document Released: 10/19/2004 Document Revised: 01/14/2013 Document Reviewed: 09/18/2012 [facility] Patient Information 2015 [place], [place]. This information is not intended to replace advice given to you by your health care provider. Make sure you discuss any questions you have with your health care provider.  Radicular Pain Radicular pain in either the arm or leg is usually from a bulging or herniated disk in the spine. A piece of the herniated disk may press against the nerves as the nerves exit the spine. This causes pain which is felt at the tips of the nerves down the arm or leg. Other causes of radicular pain may include:  Fractures.  Heart disease.  Cancer.  An abnormal and usually degenerative state of the nervous system or nerves (neuropathy). Diagnosis may require CT or MRI scanning to determine the primary cause.  Nerves that start at the neck (nerve roots) may cause radicular pain in the outer shoulder and arm. It can spread down to the thumb and fingers. The symptoms vary depending on which nerve root has been affected. In most cases radicular pain improves with conservative treatment. Neck problems may require physical therapy, a neck collar, or cervical traction. Treatment may take many weeks, and surgery may be considered if the symptoms do not improve.  Conservative treatment is also recommended for sciatica. Sciatica causes  pain to radiate from the lower back or buttock area down the leg into the foot. Often there is a history of back problems. Most patients with sciatica are better after 2 to 4 weeks of rest and other supportive care. Short term bed rest can reduce the disk pressure considerably. Sitting, however, is not a good position since this increases the pressure on the disk. You should avoid bending, lifting, and  all other activities which make the problem worse. Traction can be used in severe cases. Surgery is usually reserved for patients who do not improve within the first months of treatment. Only take over-the-counter or prescription medicines for pain, discomfort, or fever as directed by your caregiver. Narcotics and muscle relaxants may help by relieving more severe pain and spasm and by providing mild sedation. Cold or massage can give significant relief. Spinal manipulation is not recommended. It can increase the degree of disc protrusion. Epidural steroid injections are often effective treatment for radicular pain. These injections deliver medicine to the spinal nerve in the space between the protective covering of the spinal cord and back bones (vertebrae). Your caregiver can give you more information about steroid injections. These injections are most effective when given within two weeks of the onset of pain.  You should see your caregiver for follow up care as recommended. A program for neck and back injury rehabilitation with stretching and strengthening exercises is an important part of management.  SEEK IMMEDIATE MEDICAL CARE IF:  You develop increased pain, weakness, or numbness in your arm or leg.  You develop difficulty with bladder or bowel control.  You develop abdominal pain. Document Released: 02/17/2004 Document Revised: 04/03/2011 Document Reviewed: 05/04/2008 [facility] Patient Information 2015 [place], [place]. This information is not intended to replace advice given to you by your health care provider. Make sure you discuss any questions you have with your health care provider.

## 2014-02-20 NOTE — ED Notes (Signed)
MD at bedside. 

## 2014-02-20 NOTE — ED Notes (Signed)
Patient back from CT Patient asking for pain medication Will make EDP aware

## 2014-02-20 NOTE — ED Notes (Signed)
Pt reports abd pain from L flank wrapping around anteriorly to R abd. Sts he has had multiple CT scans, xrays, colonoscopy and no reason found for his pain. Pt sts pain got worse today so he wanted to be evaluated. Denies n/v.

## 2014-02-22 ENCOUNTER — Encounter: Payer: Self-pay | Admitting: Internal Medicine

## 2014-02-22 DIAGNOSIS — R918 Other nonspecific abnormal finding of lung field: Secondary | ICD-10-CM | POA: Insufficient documentation

## 2014-02-22 LAB — URINE CULTURE
Colony Count: NO GROWTH
Culture: NO GROWTH

## 2014-02-22 NOTE — Assessment & Plan Note (Addendum)
Controlled. Chronic hypoxic respiratory failure. Back at baseline after pneumonia. He admits to smoking only 2 cigarettes daily but even this is too much for him. He feels theophylline helps

## 2014-02-22 NOTE — Assessment & Plan Note (Signed)
Strongly encouraged to stop his last 2 cigarettes daily.

## 2014-02-22 NOTE — Assessment & Plan Note (Addendum)
I would think ischemia would be the most likely explanation if no other explanation is seen on CT abdomen He wanted to continue theophylline, but if pain continues this should be stopped for observation. He wants to reduce Lipitor so I suggested he reduce it to 40 mg daily 2 weeks to assess pain, and discuss this with his primary physician.

## 2014-02-23 ENCOUNTER — Ambulatory Visit (INDEPENDENT_AMBULATORY_CARE_PROVIDER_SITE_OTHER): Payer: Commercial Managed Care - HMO | Admitting: Internal Medicine

## 2014-02-23 ENCOUNTER — Encounter: Payer: Self-pay | Admitting: Internal Medicine

## 2014-02-23 ENCOUNTER — Other Ambulatory Visit: Payer: Self-pay | Admitting: *Deleted

## 2014-02-23 VITALS — BP 128/72 | HR 103 | Ht 70.0 in | Wt 145.0 lb

## 2014-02-23 DIAGNOSIS — R1033 Periumbilical pain: Secondary | ICD-10-CM

## 2014-02-23 DIAGNOSIS — F17200 Nicotine dependence, unspecified, uncomplicated: Secondary | ICD-10-CM

## 2014-02-23 DIAGNOSIS — R918 Other nonspecific abnormal finding of lung field: Secondary | ICD-10-CM

## 2014-02-23 DIAGNOSIS — J432 Centrilobular emphysema: Secondary | ICD-10-CM

## 2014-02-23 DIAGNOSIS — Z720 Tobacco use: Secondary | ICD-10-CM

## 2014-02-23 MED ORDER — DULOXETINE HCL 30 MG PO CPEP: ORAL_CAPSULE | ORAL | Status: DC

## 2014-02-23 MED ORDER — BUDESONIDE-FORMOTEROL FUMARATE 160-4.5 MCG/ACT IN AERO: 2.0000 | INHALATION_SPRAY | Freq: Two times a day (BID) | RESPIRATORY_TRACT | Status: DC

## 2014-02-23 NOTE — Assessment & Plan Note (Signed)
Concerned about the possibility that persistent pain without specific source identified on abdominal CT, might be related to his lung nodules and smoking history. Also possible that theophylline is aggravating this pain. Plan-okay to try Cymbalta as he requests for control of chronic pain. DC theophylline first.

## 2014-02-23 NOTE — Assessment & Plan Note (Addendum)
CT chest 12/2013- tree in bud areas consistent with bronchiolitis, groundglass nodules. Radiologist suggested bronchopneumonia. This pattern may resolve but will be followed up when there has been time for change, watching for possible atypical infection or areas of bronchopneumonia or adenocarcinoma causing groundglass changes. This was discussed. Inflammatory  nodules or possibly early adenocarcinomas could give this groundglass nodular pattern.There also appears to be some bronchiolitis with tree-in-bud change. With persistent complaints of abdominal pain and concern from other physicians expressed about his lung nodule follow-up, we looked at options and decided best approach would get a PET scan. This can help identify patterns, look for abdominal inflammatory or malignant area and help clarify targets for potential biopsy Plan-follow-up CT in 4 months.

## 2014-02-23 NOTE — Assessment & Plan Note (Signed)
He continues to smoke a couple of cigarettes a day against advice. We discussed theophylline which may not be making much difference for his breathing. It might contribute to his abdominal pain.

## 2014-02-23 NOTE — Patient Instructions (Addendum)
Order- PET scan neck to thigh   Dx smoker with lung nodules, peri-umbilical abdominal pain   Script to try Cymbalta for chronic pain  Stop theophylline for a few days, to see if pain gets better by that alone, before you start Cymbalta  Please try to stop smoking completly

## 2014-02-23 NOTE — Assessment & Plan Note (Signed)
Repeated counseling. He claims only smoking a couple cigarettes a day. This should not be enough to maintain a nicotine habit.

## 2014-02-23 NOTE — Progress Notes (Signed)
Subjective:    Patient ID: [name], male    DOB: [birth year], 69 y.o.   MRN: [id number]  HPI 06/23/10- 56 yo tobacco user 1/2 ppd, followed for COPD   here with dtr. Last here October 27, 2009. Since then had back surgery -did ok. For last 2-3 weeks had been tighter through chest, difficult to clear thick mucus without fever, green or sore throat. Just feels he can't clear mucus. Has had 2 steroid shots for this this in past month. Taking doxycycline and just finished prednisone. Neb with albuterol opens him only transiently. DOE but comfortable at rest. Denies chest pain. Has had a chest CT at [facility]. CT w/cm 04/01/10- emphysema, NAD, left apical scarring, compression fx's T8, T12 Nuclear bone scan 04/27/10- uptake c/w T8 vertebral compression fx.  07/06/11- 23 yo tobacco user 1/2 ppd, followed for COPD   here with dtr.  Patient states about the same as last visit. c/o  occasional sob and wheezing. Denies chest pain and chest tightness.  He is still smoking 3 packs per week, but considers this improved. We discussed cessation again.  He reports 3 episodes of thrush, using Symbicort. Discussed mouth care and aerochamber. DOE limits stairs and brisk walk. Cough productive clear white. Denies chest pain, blood. Sensitive to weather, cold, humidity.  07/05/12- 8 yo smoker, followed for COPD  FOLLOWS FOR: gives out quickly if trying to do too much; SOB as well. Has to pace himself. Reduced cigarettes to 4 or 5 a day and trying to do better. Breathing limits exercise tolerance. Wheeze, cough, clear sputum. Prescription for prednisone taper from Dr Melford Aase he did not fill. CXR 05/16/12 IMPRESSION:  COPD with stable hyperinflation. No new focal or acute abnormality  identified  Original Report Authenticated By: Ponciano Ort, M.D. PFT 07/02/2009-severe emphysema-FEV1/FVC 0.33, DLCO 71%.  09/04/12- 39 yo smoker, followed for COPD  FOLLOWS FOR: pt denies any concerns at this time-- breathing is doing  well Feeling much better. Likes the theophylline but says it is a tier IV with his insurance. Felt claustrophobic doing PFT. 6MWT 07/23/12- 97%, 94%, 98%, 342 m. No oxygen limitation on this test. PFT- 07/23/12- . Severe obstructive airways disease with insignificant response to bronchodilator. Diffusion severely reduced. FEC 1.75/37%, FEV1 0.64/18%, FEV1/FVC 0.36/48%, FEF 25-75% 38/48% predicted. DLCO 44%.  03/06/13- 36 yo smoker, followed for COPD  FOLLOWS FOR:  Breathing doing well- no concerns today Dislikes the cold weather but no acute problem  07/21/13- 70 yo smoker, followed for COPD  FOLLOWS FOR:  Increased sob, wheezing over the past month  CXR 06/30/13 IMPRESSION:  COPD without acute abnormality.  Electronically Signed  By: Franchot Gallo M.D.  On: 06/30/2013 10:31   09/03/13-67 yo smoker, followed for COPD  FOLLOWS FOR: Pt states he uses his O2 occasionally; could not tell a difference with using O2. Recent CT abd noted 5 mm RLL nodule not mentioned on report from CT chest 2012. Also noted atherosclerosis.   11/03/13 -79 yo smoker, followed for COPD, Lung nodule, complicated by atherosclerosis  FOLLOWS FOR: Gives out quickly when up moving around-recently went on golfing trip and could tell the breathing is off when swinging his club. We had given samples of Anoro and Qvar 80 to compare with Symbicort  01/29/14- 72 yo smoker, followed for COPD Seen in hospital 01/11/14 for PNA, pt reports that SOB and cough is improved. pt states that he was having some abd pain right below rib cage and in lower back - pt states  this comes and goes and is corrected with muscle relaxers and pain pills, and by lying on his stomach. He blames Lipitor for this, not theophylline. Abd CT in October for intermittent pain showed lung nodule and extensive atherosclerosis. Very little cough. No night sweats. Continues oxygen 2 L for sleep. Treated at ER 01/10/2014-pneumonia. Recent physical exam by his primary  physician "okay". Theophylline definitely helps his breathing. He thinks it is well tolerated. CT chest 01/10/14 IMPRESSION: No evidence of pulmonary embolism. Scattered tree-in-bud nodularity with mild ground-glass opacity and bronchial wall thickening in the right middle lobe and bilateral lower lobes, likely reflecting mild bronchopneumonia. Underlying moderate emphysematous changes. No frank interstitial edema. Mild thoracic lymphadenopathy, favored to be reactive. Electronically Signed  By: Julian Hy M.D.  On: 01/10/2014 14:38  02/23/14- 89 yo smoker, followed for COPD, lung nodules, complicated by atherosclerosis/CAD/PAD, abdominal pain      daughter is here His primary physician did express concern about December CT scan showing changes of bronchiolitis but also groundglass opacities which sometimes turn out to be early cancers. He denies weight loss, fever or sweat. Mild cough is dry. He now localizes abdominal pain to the area just below his navel but says it moves through to his low back such that lying down on either his stomach or back feel better. Recent colonoscopy. He went to ER January 29 for this pain. Using oxygen 2 L for sleep/[place] He researched fibromyalgia and says "it sounds like what I've got". He would like to try Cymbalta for this pain based on experience of somebody he knows with fibromyalgia. We again reviewed results of CT abdomen from last fall and CT chest from one month ago.  ROS-see HPI Constitutional:   No-   weight loss, night sweats, fevers, chills, fatigue, lassitude. HEENT:   No-  headaches, difficulty swallowing, tooth/dental problems, sore throat,       No-  sneezing, itching, ear ache, nasal congestion, post nasal drip,  CV:  No-   chest pain, orthopnea, PND, swelling in lower extremities, anasarca, dizziness, palpitations Resp: + shortness of breath with exertion or at rest.             No- productive cough,  No non-productive  cough,  No- coughing up of blood.              No-   change in color of mucus.  No-wheezing.   Skin: No-   rash or lesions. GI:   +Intermittent abdominal pain as described GU: No dysuria MS:  No-   joint pain or swelling.   Neuro-     nothing unusual Psych:  No- change in mood or affect. No depression or anxiety.  No memory loss.  OBJ- Physical Exam     O2 2L General- Alert, Oriented, Affect-appropriate, Distress- none acute, slender Skin- rash-none, lesions- none, excoriation- none Lymphadenopathy- none Head- atraumatic            Eyes- Gross vision intact, PERRLA, conjunctivae and secretions clear            Ears- Hearing, canals-normal            Nose- clear, no-Septal dev, mucus, polyps, erosion, perforation             Throat- Mallampati II , mucosa clear , drainage- none, tonsils- atrophic Neck- flexible , trachea midline, no stridor , thyroid nl, carotid no bruit Chest - symmetrical excursion , unlabored           Heart/CV-  RRR , no murmur , no gallop  , no rub, nl s1 s2                           - JVD- none , edema- none, stasis changes- none, varices- none           Lung- +distant, wheeze- none, cough- none , dullness-none, rub- none           Chest wall-  Abd- not obviously tender  Br/ Gen/ Rectal- Not done, not indicated Extrem- cyanosis- none, clubbing, none, atrophy- none, strength- nl Neuro- grossly intact to observation

## 2014-02-25 ENCOUNTER — Encounter: Payer: Self-pay | Admitting: Gastroenterology

## 2014-03-04 ENCOUNTER — Ambulatory Visit (HOSPITAL_COMMUNITY)
Admission: RE | Admit: 2014-03-04 | Discharge: 2014-03-04 | Disposition: A | Discharge status: Home / Self Care | Payer: Commercial Managed Care - HMO | Source: Ambulatory Visit | Attending: Internal Medicine | Admitting: Internal Medicine

## 2014-03-04 DIAGNOSIS — R1033 Periumbilical pain: Secondary | ICD-10-CM | POA: Insufficient documentation

## 2014-03-04 DIAGNOSIS — R918 Other nonspecific abnormal finding of lung field: Secondary | ICD-10-CM | POA: Diagnosis not present

## 2014-03-04 DIAGNOSIS — F17200 Nicotine dependence, unspecified, uncomplicated: Secondary | ICD-10-CM

## 2014-03-04 DIAGNOSIS — Z720 Tobacco use: Secondary | ICD-10-CM | POA: Diagnosis not present

## 2014-03-04 LAB — GLUCOSE, CAPILLARY: Glucose-Capillary: 87 mg/dL (ref 70–99)

## 2014-03-04 MED ORDER — FLUDEOXYGLUCOSE F - 18 (FDG) INJECTION
7.1700 | Freq: Once | INTRAVENOUS | Status: AC | PRN
  Administered 2014-03-04: 7.17 via INTRAVENOUS

## 2014-03-10 ENCOUNTER — Telehealth: Payer: Self-pay | Admitting: Internal Medicine

## 2014-03-10 NOTE — Telephone Encounter (Signed)
Notes Recorded by Deneise Lever, MD on 03/04/2014 at 2:34 PM PET scan- There is mild compression fracture in the spine, which could be associated with some pain from pinched nerves. There are small lung nodules which appear stable. We will want to look again at them in a year. Nothing shows in the abdomen as a potential source of pain, except the atherosclerosis is again noted.  --- Pt is aware of PET scan results. Nothing further was needed.

## 2014-03-16 ENCOUNTER — Other Ambulatory Visit (INDEPENDENT_AMBULATORY_CARE_PROVIDER_SITE_OTHER): Payer: Commercial Managed Care - HMO | Admitting: *Deleted

## 2014-03-16 DIAGNOSIS — I70213 Atherosclerosis of native arteries of extremities with intermittent claudication, bilateral legs: Secondary | ICD-10-CM

## 2014-03-17 LAB — LIPID PANEL
CHOL/HDL RATIO: 2
Cholesterol: 112 mg/dL (ref 0–200)
HDL: 58.2 mg/dL (ref >39.00)
LDL CALC: 46 mg/dL (ref 0–99)
NONHDL: 53.8
Triglycerides: 41 mg/dL (ref 0.0–149.0)
VLDL: 8.2 mg/dL (ref 0.0–40.0)

## 2014-03-17 LAB — BASIC METABOLIC PANEL
BUN: 17 mg/dL (ref 6–23)
CO2: 29 meq/L (ref 19–32)
Calcium: 9.7 mg/dL (ref 8.4–10.5)
Chloride: 103 mEq/L (ref 96–112)
Creatinine, Ser: 1.19 mg/dL (ref 0.40–1.50)
GFR: 64.84 mL/min (ref >60.00)
Glucose, Bld: 71 mg/dL (ref 70–99)
Potassium: 4.4 mEq/L (ref 3.5–5.1)
Sodium: 138 mEq/L (ref 135–145)

## 2014-03-17 LAB — HEPATIC FUNCTION PANEL
ALBUMIN: 4.3 g/dL (ref 3.5–5.2)
ALT: 14 U/L (ref 0–53)
AST: 21 U/L (ref 0–37)
Alkaline Phosphatase: 81 U/L (ref 39–117)
Bilirubin, Direct: 0.2 mg/dL (ref 0.0–0.3)
Total Bilirubin: 1 mg/dL (ref 0.2–1.2)
Total Protein: 6.9 g/dL (ref 6.0–8.3)

## 2014-03-24 ENCOUNTER — Telehealth: Payer: Self-pay | Admitting: Internal Medicine

## 2014-03-24 ENCOUNTER — Other Ambulatory Visit: Payer: Self-pay | Admitting: Internal Medicine

## 2014-03-24 MED ORDER — BUDESONIDE-FORMOTEROL FUMARATE 160-4.5 MCG/ACT IN AERO: 2.0000 | INHALATION_SPRAY | Freq: Two times a day (BID) | RESPIRATORY_TRACT | Status: DC

## 2014-03-24 MED ORDER — IPRATROPIUM-ALBUTEROL 0.5-2.5 (3) MG/3ML IN SOLN: 3.0000 mL | RESPIRATORY_TRACT | Status: DC | PRN

## 2014-03-24 NOTE — Telephone Encounter (Signed)
rxs send through 03/24/14 phone msg.

## 2014-03-24 NOTE — Telephone Encounter (Signed)
Rx's have sent in. Pt is aware. Nothing further was needed.

## 2014-05-04 ENCOUNTER — Ambulatory Visit (INDEPENDENT_AMBULATORY_CARE_PROVIDER_SITE_OTHER)
Admission: RE | Admit: 2014-05-04 | Discharge: 2014-05-04 | Disposition: A | Discharge status: Home / Self Care | Payer: Commercial Managed Care - HMO | Source: Ambulatory Visit | Attending: Internal Medicine | Admitting: Internal Medicine

## 2014-05-04 DIAGNOSIS — R911 Solitary pulmonary nodule: Secondary | ICD-10-CM | POA: Diagnosis not present

## 2014-05-05 ENCOUNTER — Ambulatory Visit (INDEPENDENT_AMBULATORY_CARE_PROVIDER_SITE_OTHER): Payer: Commercial Managed Care - HMO | Admitting: Internal Medicine

## 2014-05-05 ENCOUNTER — Encounter: Payer: Self-pay | Admitting: Internal Medicine

## 2014-05-05 VITALS — BP 118/62 | HR 84 | Ht 70.0 in | Wt 138.6 lb

## 2014-05-05 DIAGNOSIS — J432 Centrilobular emphysema: Secondary | ICD-10-CM | POA: Diagnosis not present

## 2014-05-05 DIAGNOSIS — I739 Peripheral vascular disease, unspecified: Secondary | ICD-10-CM

## 2014-05-05 DIAGNOSIS — R918 Other nonspecific abnormal finding of lung field: Secondary | ICD-10-CM

## 2014-05-05 DIAGNOSIS — M4850XD Collapsed vertebra, not elsewhere classified, site unspecified, subsequent encounter for fracture with routine healing: Secondary | ICD-10-CM | POA: Diagnosis not present

## 2014-05-05 DIAGNOSIS — IMO0001 Reserved for inherently not codable concepts without codable children: Secondary | ICD-10-CM

## 2014-05-05 MED ORDER — DULOXETINE HCL 30 MG PO CPEP: ORAL_CAPSULE | ORAL | Status: DC

## 2014-05-05 NOTE — Patient Instructions (Addendum)
We plan to repeat CT chest in 6 months to follow the nodule  Ask Dr Melford Aase his opinion about your back pain management and your weight loss. I will see if our study of Ensure supplementation for COPD is still open.  Cymbalta for chronic pain refilled  Please cal as needed

## 2014-05-05 NOTE — Assessment & Plan Note (Signed)
We again discussed possibility that his known atherosclerotic disease was interfering with GI perfusion

## 2014-05-05 NOTE — Assessment & Plan Note (Signed)
Plan-CT report reviewed. He agrees with scheduling follow-up chest CT for lung nodule in 6 more months.

## 2014-05-05 NOTE — Assessment & Plan Note (Signed)
Severe emphysema. I hope he can stay off of cigarettes long enough stabilize this.

## 2014-05-05 NOTE — Assessment & Plan Note (Signed)
Cymbalta has significantly helped him cope with his back pain. Refill granted. He will ask his primary physician about further referral for management.

## 2014-05-05 NOTE — Progress Notes (Signed)
Subjective:    Patient ID: [name], male    DOB: [birth year], 69 y.o.   MRN: [id number]  HPI 06/23/10- 62 yo tobacco user 1/2 ppd, followed for COPD   here with dtr. Last here October 27, 2009. Since then had back surgery -did ok. For last 2-3 weeks had been tighter through chest, difficult to clear thick mucus without fever, green or sore throat. Just feels he can't clear mucus. Has had 2 steroid shots for this this in past month. Taking doxycycline and just finished prednisone. Neb with albuterol opens him only transiently. DOE but comfortable at rest. Denies chest pain. Has had a chest CT at [facility]. CT w/cm 04/01/10- emphysema, NAD, left apical scarring, compression fx's T8, T12 Nuclear bone scan 04/27/10- uptake c/w T8 vertebral compression fx.  07/06/11- 45 yo tobacco user 1/2 ppd, followed for COPD   here with dtr.  Patient states about the same as last visit. c/o  occasional sob and wheezing. Denies chest pain and chest tightness.  He is still smoking 3 packs per week, but considers this improved. We discussed cessation again.  He reports 3 episodes of thrush, using Symbicort. Discussed mouth care and aerochamber. DOE limits stairs and brisk walk. Cough productive clear white. Denies chest pain, blood. Sensitive to weather, cold, humidity.  07/05/12- 67 yo smoker, followed for COPD  FOLLOWS FOR: gives out quickly if trying to do too much; SOB as well. Has to pace himself. Reduced cigarettes to 4 or 5 a day and trying to do better. Breathing limits exercise tolerance. Wheeze, cough, clear sputum. Prescription for prednisone taper from Dr Melford Aase he did not fill. CXR 05/16/12 IMPRESSION:  COPD with stable hyperinflation. No new focal or acute abnormality  identified  Original Report Authenticated By: Ponciano Ort, M.D. PFT 07/02/2009-severe emphysema-FEV1/FVC 0.33, DLCO 71%.  09/04/12- 28 yo smoker, followed for COPD  FOLLOWS FOR: pt denies any concerns at this time-- breathing is doing  well Feeling much better. Likes the theophylline but says it is a tier IV with his insurance. Felt claustrophobic doing PFT. 6MWT 07/23/12- 97%, 94%, 98%, 342 m. No oxygen limitation on this test. PFT- 07/23/12- . Severe obstructive airways disease with insignificant response to bronchodilator. Diffusion severely reduced. FEC 1.75/37%, FEV1 0.64/18%, FEV1/FVC 0.36/48%, FEF 25-75% 38/48% predicted. DLCO 44%.  03/06/13- 60 yo smoker, followed for COPD  FOLLOWS FOR:  Breathing doing well- no concerns today Dislikes the cold weather but no acute problem  07/21/13- 25 yo smoker, followed for COPD  FOLLOWS FOR:  Increased sob, wheezing over the past month  CXR 06/30/13 IMPRESSION:  COPD without acute abnormality.  Electronically Signed  By: Franchot Gallo M.D.  On: 06/30/2013 10:31   09/03/13-67 yo smoker, followed for COPD  FOLLOWS FOR: Pt states he uses his O2 occasionally; could not tell a difference with using O2. Recent CT abd noted 5 mm RLL nodule not mentioned on report from CT chest 2012. Also noted atherosclerosis.   11/03/13 -10 yo smoker, followed for COPD, Lung nodule, complicated by atherosclerosis  FOLLOWS FOR: Gives out quickly when up moving around-recently went on golfing trip and could tell the breathing is off when swinging his club. We had given samples of Anoro and Qvar 80 to compare with Symbicort  01/29/14- 27 yo smoker, followed for COPD Seen in hospital 01/11/14 for PNA, pt reports that SOB and cough is improved. pt states that he was having some abd pain right below rib cage and in lower back - pt states  this comes and goes and is corrected with muscle relaxers and pain pills, and by lying on his stomach. He blames Lipitor for this, not theophylline. Abd CT in October for intermittent pain showed lung nodule and extensive atherosclerosis. Very little cough. No night sweats. Continues oxygen 2 L for sleep. Treated at ER 01/10/2014-pneumonia. Recent physical exam by his primary  physician "okay". Theophylline definitely helps his breathing. He thinks it is well tolerated. CT chest 01/10/14 IMPRESSION: No evidence of pulmonary embolism. Scattered tree-in-bud nodularity with mild ground-glass opacity and bronchial wall thickening in the right middle lobe and bilateral lower lobes, likely reflecting mild bronchopneumonia. Underlying moderate emphysematous changes. No frank interstitial edema. Mild thoracic lymphadenopathy, favored to be reactive. Electronically Signed  By: Julian Hy M.D.  On: 01/10/2014 14:38  02/23/14- 32 yo smoker, followed for COPD, lung nodules, complicated by atherosclerosis/CAD/PAD, abdominal pain      daughter is here His primary physician did express concern about December CT scan showing changes of bronchiolitis but also groundglass opacities which sometimes turn out to be early cancers. He denies weight loss, fever or sweat. Mild cough is dry. He now localizes abdominal pain to the area just below his navel but says it moves through to his low back such that lying down on either his stomach or back feel better. Recent colonoscopy. He went to ER January 29 for this pain. Using oxygen 2 L for sleep/[place] He researched fibromyalgia and says "it sounds like what I've got". He would like to try Cymbalta for this pain based on experience of somebody he knows with fibromyalgia. We again reviewed results of CT abdomen from last fall and CT chest from one month ago.  05/05/14- 54 yo smoker, followed for COPD, lung nodules, complicated by atherosclerosis/CAD/PAD, abdominal pain      daughter is here FOLLOWS FOR: SOB continues with exertion; order has been placed for non contrast CT chest for 6 months from today. Cymbalta has significantly helped his back pain. 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 Continues to lose weight but he says in just the last week and a half he has started eating much better. We discussed a local trial studying  impact of ensure food supplements on COPD stability. PET  03/04/14-  IMPRESSION: 1. 4 mm left lower lobe pulmonary nodule is below the resolution of PET. A smaller left lower lobe nodule is also indeterminate and below PET resolution. Both or similar back to 01/10/2014. Per consensus criteria, followup with chest CT at 1 year is recommended. This recommendation follows the consensus statement: Guidelines for Management of Small Pulmonary Nodules Detected on CT Scans: A Statement from the [employer] as published in Radiology 2005; 237:395-400. 2. Atherosclerosis, including within the coronary arteries. 3. Mild hypermetabolism corresponding to a mild T9 vertebral compression fracture. 4. Emphysema. Electronically Signed  By: Abigail Miyamoto M.D.  On: 03/04/2014 09:39 CTchest 05/04/14 IMPRESSION: 1. The small left lower lobe pulmonary nodules are unchanged from CT of 4 months ago, although 1 has enlarged from 2012. No new nodules demonstrated. 2. Interval resolution of mediastinal and hilar lymphadenopathy. 3. Emphysema without evidence of residual inflammatory change. 4. Given risk factors for bronchogenic carcinoma, follow-up chest CT at 6-12 months is recommended. This recommendation follows the consensus statement: Guidelines for Management of Small Pulmonary Nodules Detected on CT Scans: A Statement from the [employer] as published in Radiology 2005;237:395-400. Electronically Signed  By: Richardean Sale M.D.  On: 05/04/2014 09:27   ROS-see HPI Constitutional:   No-   weight  loss, night sweats, fevers, chills, fatigue, lassitude. HEENT:   No-  headaches, difficulty swallowing, tooth/dental problems, sore throat,       No-  sneezing, itching, ear ache, nasal congestion, post nasal drip,  CV:  No-   chest pain, orthopnea, PND, swelling in lower extremities, anasarca, dizziness, palpitations Resp: + shortness of breath with exertion or at rest.             No-  productive cough,  No non-productive cough,  No- coughing up of blood.              No-   change in color of mucus.  No-wheezing.   Skin: No-   rash or lesions. GI:   +Intermittent abdominal pain as described GU: No dysuria MS:  No-   joint pain or swelling.   Neuro-     nothing unusual Psych:  No- change in mood or affect. No depression or anxiety.  No memory loss.  OBJ- Physical Exam     O2 2L General- Alert, Oriented, Affect-appropriate, Distress- none acute, slender Skin- rash-none, lesions- none, excoriation- none Lymphadenopathy- none Head- atraumatic            Eyes- Gross vision intact, PERRLA, conjunctivae and secretions clear            Ears- Hearing, canals-normal            Nose- clear, no-Septal dev, mucus, polyps, erosion, perforation             Throat- Mallampati II , mucosa clear , drainage- none, tonsils- atrophic Neck- flexible , trachea midline, no stridor , thyroid nl, carotid no bruit Chest - symmetrical excursion , unlabored           Heart/CV- RRR , no murmur , no gallop  , no rub, nl s1 s2                           - JVD- none , edema- none, stasis changes- none, varices- none           Lung- +distant, wheeze- none, cough- none , dullness-none, rub- none           Chest wall-  Abd- not obviously tender  Br/ Gen/ Rectal- Not done, not indicated Extrem- cyanosis- none, clubbing, none, atrophy- none, strength- nl Neuro- grossly intact to observation

## 2014-05-07 ENCOUNTER — Telehealth: Payer: Self-pay | Admitting: Internal Medicine

## 2014-05-07 DIAGNOSIS — R918 Other nonspecific abnormal finding of lung field: Secondary | ICD-10-CM

## 2014-05-07 NOTE — Telephone Encounter (Signed)
Notes Recorded by Deneise Lever, MD on 05/04/2014 at 10:01 PM CT chest- lymph nodes are smaller. One of the lung nodules had enlarged compared with 2012, but there has been no change in the last 4 months. The radiologist recommends a follow up CT in 6-12 months.  I recommend a non-contrast CT chest for dx lung nodules in 6 months ------------------------------------- Pt aware of results and recs.  Ct ordered.  Nothing further needed.

## 2014-05-13 ENCOUNTER — Encounter: Payer: Self-pay | Admitting: Internal Medicine

## 2014-05-13 ENCOUNTER — Ambulatory Visit (INDEPENDENT_AMBULATORY_CARE_PROVIDER_SITE_OTHER): Payer: Commercial Managed Care - HMO | Admitting: Internal Medicine

## 2014-05-13 VITALS — BP 114/76 | HR 92 | Temp 97.9°F | Resp 16 | Ht 70.0 in | Wt 137.0 lb

## 2014-05-13 DIAGNOSIS — E785 Hyperlipidemia, unspecified: Secondary | ICD-10-CM

## 2014-05-13 DIAGNOSIS — R030 Elevated blood-pressure reading, without diagnosis of hypertension: Secondary | ICD-10-CM

## 2014-05-13 DIAGNOSIS — R7303 Prediabetes: Secondary | ICD-10-CM

## 2014-05-13 DIAGNOSIS — J432 Centrilobular emphysema: Secondary | ICD-10-CM

## 2014-05-13 DIAGNOSIS — Z0000 Encounter for general adult medical examination without abnormal findings: Secondary | ICD-10-CM

## 2014-05-13 DIAGNOSIS — Z79899 Other long term (current) drug therapy: Secondary | ICD-10-CM

## 2014-05-13 DIAGNOSIS — Z1331 Encounter for screening for depression: Secondary | ICD-10-CM

## 2014-05-13 DIAGNOSIS — N183 Chronic kidney disease, stage 3 unspecified: Secondary | ICD-10-CM

## 2014-05-13 DIAGNOSIS — Z0001 Encounter for general adult medical examination with abnormal findings: Secondary | ICD-10-CM

## 2014-05-13 DIAGNOSIS — E559 Vitamin D deficiency, unspecified: Secondary | ICD-10-CM

## 2014-05-13 DIAGNOSIS — Z9181 History of falling: Secondary | ICD-10-CM

## 2014-05-13 DIAGNOSIS — Z23 Encounter for immunization: Secondary | ICD-10-CM

## 2014-05-13 DIAGNOSIS — R7309 Other abnormal glucose: Secondary | ICD-10-CM

## 2014-05-13 DIAGNOSIS — IMO0001 Reserved for inherently not codable concepts without codable children: Secondary | ICD-10-CM

## 2014-05-13 DIAGNOSIS — R6889 Other general symptoms and signs: Secondary | ICD-10-CM

## 2014-05-13 LAB — CBC WITH DIFFERENTIAL/PLATELET
Basophils Absolute: 0.1 10*3/uL (ref 0.0–0.1)
Basophils Relative: 1 % (ref 0–1)
EOS ABS: 0.3 10*3/uL (ref 0.0–0.7)
EOS PCT: 5 % (ref 0–5)
HCT: 39.3 % (ref 39.0–52.0)
Hemoglobin: 13 g/dL (ref 13.0–17.0)
Lymphocytes Relative: 18 % (ref 12–46)
Lymphs Abs: 1 10*3/uL (ref 0.7–4.0)
MCH: 31 pg (ref 26.0–34.0)
MCHC: 33.1 g/dL (ref 30.0–36.0)
MCV: 93.8 fL (ref 78.0–100.0)
MPV: 11.5 fL (ref 8.6–12.4)
Monocytes Absolute: 1 10*3/uL (ref 0.1–1.0)
Monocytes Relative: 17 % — ABNORMAL HIGH (ref 3–12)
NEUTROS PCT: 59 % (ref 43–77)
Neutro Abs: 3.4 10*3/uL (ref 1.7–7.7)
PLATELETS: 184 10*3/uL (ref 150–400)
RBC: 4.19 MIL/uL — ABNORMAL LOW (ref 4.22–5.81)
RDW: 13.4 % (ref 11.5–15.5)
WBC: 5.8 10*3/uL (ref 4.0–10.5)

## 2014-05-13 NOTE — Progress Notes (Signed)
Patient ID: [name], male   DOB: 1945-02-15, 69 y.o.   MRN: [id number]  MEDICARE ANNUAL WELLNESS VISIT AND OV  Assessment:   1. Elevated BP  - TSH  2. Labile Hypertension   3. Hyperlipidemia  - Lipid panel  4. Prediabetes  - Hemoglobin A1c - Insulin, random  5. Vitamin D deficiency  - Vit D  25 hydroxy   6. End Stage O2 Dependent COPD   7. CKD Stage III (GFR 53 ml/min)   8. Medication management  - CBC with Differential/Platelet - BASIC METABOLIC PANEL WITH GFR - Hepatic function panel - Magnesium  9. Routine general medical examination at a health care facility   10. Depression screen- Screen Negative  11. At low risk for fall   Plan:   During the course of the visit the patient was educated and counseled about appropriate screening and preventive services including:    Pneumococcal vaccine   Influenza vaccine  Td vaccine  Screening electrocardiogram  Bone densitometry screening  Colorectal cancer screening  Diabetes screening  Glaucoma screening  Nutrition counseling   Advanced directives: requested  Screening recommendations, referrals: Vaccinations: Immunization History  Administered Date(s) Administered  . Influenza Split 10/24/2011, 09/23/2012  . Influenza Whole 12/23/2008, 10/23/2009, 09/24/2010  . Influenza, High Dose Seasonal PF 10/06/2013  . Pneumococcal Polysaccharide-23 10/23/2008, 09/15/2012  Tdap vaccine 2007 Prevnar vaccine 05/13/2014 today Shingles vaccine declined Hep B vaccine not indicated  Nutrition assessed and recommended  Colonoscopy 02/18/2014 Recommended yearly ophthalmology/optometry visit for glaucoma screening and checkup Recommended yearly dental visit for hygiene and checkup Advanced directives - No - offered forms  Conditions/risks identified: BMI: Discussed weight loss, diet, and increase physical activity.  Increase physical activity: AHA recommends 150 minutes of physical activity a week.   Medications reviewed Diabetes is not at goal, ACE/ARB therapy: Not Indicated Urinary Incontinence is not an issue: discussed non pharmacology and pharmacology options.  Fall risk: low- discussed PT, home fall assessment, medications.   Subjective:    [name]  presents for Medicare Annual Wellness Visit and OV.  Date of prior medicare wellness visit is unknown.  This very nice 69 y.o.male presents for 3 month follow up with Hypertension, severe end stage O2 Dependent COPD, Hyperlipidemia, Pre-Diabetes and Vitamin D Deficiency.    Patient is monitored expectantly for HTN & BP has been controlled at home. Today's BP: 114/76 mmHg. Patient has had no complaints of any cardiac type chest pain, palpitations, dyspnea/orthopnea/PND, dizziness, claudication, or dependent edema. Patient has COPD and is followed closely by Dr Keturah Barre. Patient is on 2 LPM O2. He admits a dry non productive cough.   Hyperlipidemia is controlled with diet & meds. Patient denies myalgias or other med SE's. Last Lipids were at goal - Total Chol 112; HDL 58; LDL 46; Trig 41 on 03/16/2014.   Also, the patient has history of PreDiabetes and has had no symptoms of reactive hypoglycemia, diabetic polys, paresthesias or visual blurring.  Last A1c was not at goal - 6.3% on 01/28/2014.   Further, the patient also has history of Vitamin D Deficiency and supplements vitamin D without any suspected side-effects. Last vitamin D was still low at 34 on 01/28/2014.  Names of Other Physician/Practitioners you currently use: 1. [facility] here for primary care 2. Dr Einar Gip, [place], eye doctor, last visit 2014 and has scheduled appt 05/21/2014 3. Dr Olena Heckle, [place], dentist, last visit every 6 months - due May   Patient Care Team: [name]  Donald Aase, MD as PCP - General (Internal Medicine) Donald Castle, MD as Consulting Physician (Gastroenterology) Donald Lever, MD as Consulting Physician (Pulmonary  Disease) Donald Monarch, MD as Consulting Physician (Dermatology) Donald Bob, MD as Consulting Physician (Orthopedic Surgery) Donald Headings, MD as Consulting Physician (Cardiology)  Medication Review: Medication Sig  . albuterol  HFA 108  inhaler Inhale 2 puffs into the lungs every 6 (six) hours as needed for wheezing or shortness of breath.  Marland Kitchen aspirin 81 MG tablet Take 81 mg by mouth daily.  Marland Kitchen atorvastatin  40 MG tablet Take 40 mg by mouth 2 (two) times daily.  . SYMBICORT 160-4.5  inhaler Inhale 2 puffs into the lungs 2 (two) times daily. rinse mouth  . VITAMIN D Take 5,000 Units by mouth daily.  . DULoxetine (CYMBALTA) 30 MG capsule 1 x 3 days, then 2 daily for pain  . NORCO 5-325 MG per tablet Take 1 tablet by mouth every 6 (six) hours as needed for moderate pain.  Marland Kitchen ibuprofen  200 MG tablet Take 400 mg by mouth every 6 (six) hours as needed for headache or moderate pain.   . DUONEB 0.5-2.5 (3) MG/3ML Take 3 mLs by nebulization every 4 (four) hours as needed. DX COPD J44.9  . methocarbamol  500 MG tablet Take 500 mg by mouth 2 (two) times daily as needed for muscle spasms.    Current Problems (verified) Patient Active Problem List   Diagnosis Date Noted  . Lung nodules 02/22/2014  . History of colonic polyps 12/10/2013  . Dyspnea 12/05/2013  . Peripheral arterial disease 12/05/2013  . CKD Stage III (GFR 53 ml/min) 04/13/2013  . Prediabetes 03/31/2013  . Medication management 03/31/2013  . Hyperlipidemia   . Vitamin D deficiency   . Labile Hypertension   . DDD (degenerative disc disease)   . Vertebral compression fracture 06/25/2010  . TOBACCO USER 03/03/2009  . COPD with emphysema 06/25/2008    Screening Tests Health Maintenance  Topic Date Due  . ZOSTAVAX  05/14/2007  . PNA vac Low Risk Adult (2 of 2 - PCV13) 09/15/2013  . INFLUENZA VACCINE  08/24/2014  . TETANUS/TDAP  01/24/2015  . COLONOSCOPY  02/19/2015    Immunization History  Administered Date(s)  Administered  . Influenza Split 10/24/2011, 09/23/2012  . Influenza Whole 12/23/2008, 10/23/2009, 09/24/2010  . Influenza, High Dose Seasonal PF 10/06/2013  . Pneumococcal Polysaccharide-23 10/23/2008, 09/15/2012    Preventative care: Last colonoscopy: 02/18/2014  History reviewed: allergies, current medications, past family history, past medical history, past social history, past surgical history and problem list  Risk Factors: Tobacco History  Substance Use Topics  . Smoking status: Current Every Day Smoker    Types: Cigarettes  . Smokeless tobacco: Never Used     Comment: 2 CIGS DAILY  . Alcohol Use: No   He does smoke and is recalcitrant to stopping smoking. Are there smokers in your home (other than you)?  No  Alcohol Current alcohol use: none  Caffeine Current caffeine use: coffee 2 cups /day and tea 2 glasses /day  Exercise Current exercise: none  Nutrition/Diet Current diet: in general, a "healthy" diet    Cardiac risk factors: advanced age (older than 12 for men, 65 for women), hypertension, male gender, sedentary lifestyle and smoking/ tobacco exposure.  Depression Screen (Note: if answer to either of the following is "Yes", a more complete depression screening is indicated)   Q1: Over the past two weeks, have you felt down, depressed or hopeless?  No  Q2: Over the past two weeks, have you felt little interest or pleasure in doing things? No  Have you lost interest or pleasure in daily life? No  Do you often feel hopeless? No  Do you cry easily over simple problems? No  Activities of Daily Living In your present state of health, do you have any difficulty performing the following activities?:  Driving? No Managing money?  No Feeding yourself? No Getting from bed to chair? No Climbing a flight of stairs? No Preparing food and eating?: No Bathing or showering? No Getting dressed: No Getting to the toilet? No Using the toilet:No Moving around from  place to place: No In the past year have you fallen or had a near fall?:No   Are you sexually active?  No  Do you have more than one partner?  No  Vision Difficulties: No  Hearing Difficulties: No Do you often ask people to speak up or repeat themselves? No Do you experience ringing or noises in your ears? No Do you have difficulty understanding soft or whispered voices? No  Cognition  Do you feel that you have a problem with memory?No  Do you often misplace items? No  Do you feel safe at home?  Yes  Advanced directives Does patient have a [place]? No - offered forms Does patient have a Living Will? No - offered forms  Past Medical History  Diagnosis Date  . Epistaxis age 48    after blunt facial trauma. needed surgical clamps and 40 units of blood  . COPD (chronic obstructive pulmonary disease)   . Hyperlipidemia   . DDD (degenerative disc disease)   . Unspecified vitamin D deficiency   . Elevated blood pressure reading without diagnosis of hypertension   . DJD (degenerative joint disease)   . Colon polyps   . Asthma   . Osteoporosis   . Oxygen deficiency     2 LITERS, STARTED 3-4 MONTHS AGO   Past Surgical History  Procedure Laterality Date  . Bilateral knee arthroscopy    . Surgical hemostasis      in sinuses after [place]  . Prostate biopsy    . Back surgery  04-2010    disc replacement  . Colonoscopy     ROS: Constitutional: Denies fever, chills, weight loss/gain, headaches, insomnia, fatigue, night sweats or change in appetite. Eyes: Denies redness, blurred vision, diplopia, discharge, itchy or watery eyes.  ENT: Denies discharge, congestion, post nasal drip, epistaxis, sore throat, earache, hearing loss, dental pain, Tinnitus, Vertigo, Sinus pain or snoring.  Cardio: Denies chest pain, palpitations, irregular heartbeat, syncope, dyspnea, diaphoresis, orthopnea, PND, claudication or edema Respiratory: denies cough, dyspnea, DOE, pleurisy,  hoarseness, laryngitis or wheezing.  Gastrointestinal: Denies dysphagia, heartburn, reflux, water brash, pain, cramps, nausea, vomiting, bloating, diarrhea, constipation, hematemesis, melena, hematochezia, jaundice or hemorrhoids Genitourinary: Denies dysuria, frequency, urgency, nocturia, hesitancy, discharge, hematuria or flank pain Musculoskeletal: Denies arthralgia, myalgia, stiffness, Jt. Swelling, pain, limp or strain/sprain. Denies Falls. Skin: Denies puritis, rash, hives, warts, acne, eczema or change in skin lesion Neuro: No weakness, tremor, incoordination, spasms, paresthesia or pain Psychiatric: Denies confusion, memory loss or sensory loss. Denies Depression. Endocrine: Denies change in weight, skin, hair change, nocturia, and paresthesia, diabetic polys, visual blurring or hyper / hypo glycemic episodes.  Heme/Lymph: No excessive bleeding, bruising or enlarged lymph nodes.  Objective:     BP 114/76 mmHg  Pulse 92  Temp(Src) 97.9 F (36.6 C)  Resp 16  Ht  5\' 10"  (1.778 m)  Wt 137 lb (62.143 kg)  BMI 19.66 kg/m2  General Appearance:  Alert  WD/WN, male  in no apparent distress. Eyes: PERRLA, EOMs nl, conjunctiva normal, normal fundi and vessels. Sinuses: No frontal/maxillary tenderness ENT/Mouth: EACs patent / TMs  nl. Nares clear without erythema, swelling, mucoid exudates. Oral hygiene is good. No erythema, swelling, or exudate. Tongue normal, non-obstructing. Tonsils not swollen or erythematous. Hearing normal.  Neck: Supple, thyroid normal. No bruits, nodes or JVD. Respiratory: Respiratory effort normal.  BS equal and clear bilateral without rales, rhonci, wheezing or stridor. Cardio: Heart sounds are normal with regular rate and rhythm and no murmurs, rubs or gallops. Peripheral pulses are normal and equal bilaterally without edema. No aortic or femoral bruits. Chest: symmetric with normal excursions and percussion.  Abdomen: Flat, soft, with nl bowel sounds. Nontender,  no guarding, rebound, hernias, masses, or organomegaly.  Lymphatics: Non tender without lymphadenopathy.  Musculoskeletal: Full ROM all peripheral extremities, joint stability, 5/5 strength, and normal gait. Skin: Warm and dry without rashes, lesions, cyanosis, clubbing or  ecchymosis.  Neuro: Cranial nerves intact, reflexes equal bilaterally. Normal muscle tone, no cerebellar symptoms. Sensation intact.  Pysch: Alert and oriented X 3 with normal affect, insight and judgment appropriate.   Cognitive Testing  Alert? Yes  Normal Appearance? Yes  Oriented to person? Yes  Place? Yes   Time? Yes  Recall of three objects?  Yes  Can perform simple calculations? Yes  Displays appropriate judgment? Yes  Can read the correct time from a watch/clock? Yes  Medicare Attestation I have personally reviewed: The patient's medical and social history Their use of alcohol, tobacco or illicit drugs Their current medications and supplements The patient's functional ability including ADLs,fall risks, home safety risks, cognitive, and hearing and visual impairment Diet and physical activities Evidence for depression or mood disorders  The patient's weight, height, BMI, and visual acuity have been recorded in the chart.  I have made referrals, counseling, and provided education to the patient based on review of the above and I have provided the patient with a written personalized care plan for preventive services.  Over 40 minutes of exam, counseling, chart review was performed.  Kaislee Chao DAVID, MD   05/13/2014

## 2014-05-13 NOTE — Patient Instructions (Signed)

## 2014-05-14 LAB — HEPATIC FUNCTION PANEL
ALT: 17 U/L (ref 0–53)
AST: 26 U/L (ref 0–37)
Albumin: 4 g/dL (ref 3.5–5.2)
Alkaline Phosphatase: 81 U/L (ref 39–117)
BILIRUBIN DIRECT: 0.2 mg/dL (ref 0.0–0.3)
BILIRUBIN TOTAL: 0.6 mg/dL (ref 0.2–1.2)
Indirect Bilirubin: 0.4 mg/dL (ref 0.2–1.2)
Total Protein: 6.2 g/dL (ref 6.0–8.3)

## 2014-05-14 LAB — HEMOGLOBIN A1C
Hgb A1c MFr Bld: 5.8 % — ABNORMAL HIGH (ref <5.7)
Mean Plasma Glucose: 120 mg/dL — ABNORMAL HIGH (ref <117)

## 2014-05-14 LAB — VITAMIN D 25 HYDROXY (VIT D DEFICIENCY, FRACTURES): VIT D 25 HYDROXY: 67 ng/mL (ref 30–100)

## 2014-05-14 LAB — BASIC METABOLIC PANEL WITH GFR
BUN: 16 mg/dL (ref 6–23)
CHLORIDE: 102 meq/L (ref 96–112)
CO2: 30 mEq/L (ref 19–32)
Calcium: 9.8 mg/dL (ref 8.4–10.5)
Creat: 1.15 mg/dL (ref 0.50–1.35)
GFR, Est African American: 76 mL/min
GFR, Est Non African American: 66 mL/min
Glucose, Bld: 95 mg/dL (ref 70–99)
Potassium: 4.9 mEq/L (ref 3.5–5.3)
SODIUM: 140 meq/L (ref 135–145)

## 2014-05-14 LAB — LIPID PANEL
CHOL/HDL RATIO: 1.6 ratio
CHOLESTEROL: 104 mg/dL (ref 0–200)
HDL: 64 mg/dL (ref >=40)
LDL Cholesterol: 27 mg/dL (ref 0–99)
Triglycerides: 66 mg/dL (ref <150)
VLDL: 13 mg/dL (ref 0–40)

## 2014-05-14 LAB — INSULIN, RANDOM: INSULIN: 15.4 u[IU]/mL (ref 2.0–19.6)

## 2014-05-14 LAB — MAGNESIUM: Magnesium: 2.1 mg/dL (ref 1.5–2.5)

## 2014-05-14 LAB — TSH: TSH: 0.777 u[IU]/mL (ref 0.350–4.500)

## 2014-05-14 NOTE — Addendum Note (Signed)
Addended by: Melbourne Abts C on: 05/14/2014 08:55 AM   Modules accepted: Orders

## 2014-06-15 ENCOUNTER — Ambulatory Visit (INDEPENDENT_AMBULATORY_CARE_PROVIDER_SITE_OTHER): Payer: Commercial Managed Care - HMO | Admitting: Physician Assistant

## 2014-06-15 ENCOUNTER — Encounter: Payer: Self-pay | Admitting: Physician Assistant

## 2014-06-15 VITALS — BP 132/78 | HR 100 | Temp 97.7°F | Resp 16 | Ht 70.0 in | Wt 139.0 lb

## 2014-06-15 DIAGNOSIS — J441 Chronic obstructive pulmonary disease with (acute) exacerbation: Secondary | ICD-10-CM

## 2014-06-15 MED ORDER — AZITHROMYCIN 250 MG PO TABS: ORAL_TABLET | ORAL | Status: AC

## 2014-06-15 MED ORDER — PREDNISONE 20 MG PO TABS: ORAL_TABLET | ORAL | Status: AC

## 2014-06-15 NOTE — Patient Instructions (Signed)

## 2014-06-15 NOTE — Progress Notes (Signed)
Subjective:    Patient ID: [name], male    DOB: 27-Feb-1945, 69 y.o.   MRN: [id number]  HPI The patient is currently having symptoms / an exacerbation. Current symptoms include acute dyspnea, cough productive of yellow sputum in moderate amounts and wheezing. Symptoms have been present since a week ago and have been gradually worsening. He denies weight loss, chills and fever. Associated symptoms include fatigue, poor exercise tolerance, shortness of breath, wheezing and increase in sputum and changed colors to yellow.  This episode appears to have been triggered by no identifiable factor. Treatments tried for the current exacerbation: albuterol inhaler, oxygen, salmeterol/fluticasone inhaler and duoneb at home. Follows with Dr. Annamaria Boots.   Past Medical History  Diagnosis Date  . Epistaxis age 28    after blunt facial trauma. needed surgical clamps and 40 units of blood  . COPD (chronic obstructive pulmonary disease)   . Hyperlipidemia   . DDD (degenerative disc disease)   . Unspecified vitamin D deficiency   . Elevated blood pressure reading without diagnosis of hypertension   . DJD (degenerative joint disease)   . Colon polyps   . Asthma   . Osteoporosis   . Oxygen deficiency     2 LITERS, STARTED 3-4 MONTHS AGO     Current Outpatient Prescriptions on File Prior to Visit  Medication Sig Dispense Refill  . albuterol (PROVENTIL HFA;VENTOLIN HFA) 108 (90 BASE) MCG/ACT inhaler Inhale 2 puffs into the lungs every 6 (six) hours as needed for wheezing or shortness of breath. 1 Inhaler 6  . aspirin 81 MG tablet Take 81 mg by mouth daily.    Marland Kitchen atorvastatin (LIPITOR) 40 MG tablet Take 40 mg by mouth 2 (two) times daily.    . budesonide-formoterol (SYMBICORT) 160-4.5 MCG/ACT inhaler Inhale 2 puffs into the lungs 2 (two) times daily. rinse mouth 1 Inhaler 5  . Cholecalciferol (VITAMIN D-3 PO) Take 5,000 Units by mouth daily.    . DULoxetine (CYMBALTA) 30 MG capsule 1 x 3 days, then 2 daily for  pain 60 capsule 11  . HYDROcodone-acetaminophen (NORCO) 5-325 MG per tablet Take 1 tablet by mouth every 6 (six) hours as needed for moderate pain. 60 tablet 0  . ibuprofen (ADVIL,MOTRIN) 200 MG tablet Take 400 mg by mouth every 6 (six) hours as needed for headache or moderate pain.     Marland Kitchen ipratropium-albuterol (DUONEB) 0.5-2.5 (3) MG/3ML SOLN Take 3 mLs by nebulization every 4 (four) hours as needed. DX COPD J44.9 225 mL 5  . methocarbamol (ROBAXIN) 500 MG tablet Take 500 mg by mouth 2 (two) times daily as needed for muscle spasms.     . Vitamins A & D 5000-400 UNITS CAPS      No current facility-administered medications on file prior to visit.    Review of Systems  Constitutional: Negative for fever, chills and diaphoresis.  HENT: Negative.  Negative for congestion, ear pain, postnasal drip, rhinorrhea, sinus pressure, sneezing, sore throat, trouble swallowing and voice change.   Eyes: Negative.   Respiratory: Positive for cough, shortness of breath and wheezing. Negative for chest tightness.   Cardiovascular: Negative.  Negative for chest pain, palpitations and leg swelling.  Gastrointestinal: Negative.   Genitourinary: Negative.   Musculoskeletal: Negative.  Negative for neck pain.  Neurological: Negative.  Negative for headaches.       Objective:   Physical Exam  Constitutional: He is oriented to person, place, and time. No distress.  Eyes: Pupils are equal, round, and reactive to  light.  Neck: Normal range of motion. Neck supple.  Cardiovascular: Normal rate and regular rhythm.   Pulmonary/Chest: Effort normal. No respiratory distress. He has decreased breath sounds (diffuse). He has no wheezes. He has rhonchi (bilateral lower lobes, right worse than left). He has no rales.  O2 97% RA  Abdominal: Soft. Bowel sounds are normal. There is no tenderness.  Lymphadenopathy:    He has no cervical adenopathy.  Neurological: He is alert and oriented to person, place, and time.  Skin:  Skin is warm and dry. He is not diaphoretic.       Assessment & Plan:  1. COPD exacerbation Advised to stop smoking,  continue meds, if not better/worsening follow up here or with Dr. Annamaria Boots.  - azithromycin (ZITHROMAX) 250 MG tablet; Take 2 tablets (500 mg) on  Day 1,  followed by 1 tablet (250 mg) once daily on Days 2 through 5.  Dispense: 6 each; Refill: 1 - predniSONE (DELTASONE) 20 MG tablet; 3 tablets daily with food for 3 days, 2 tabs daily for 3 days, 1 tab a day for 5 days.  Dispense: 20 tablet; Refill: 0

## 2014-06-23 ENCOUNTER — Telehealth: Payer: Self-pay | Admitting: Physician Assistant

## 2014-06-23 MED ORDER — LEVOFLOXACIN 500 MG PO TABS: 500.0000 mg | ORAL_TABLET | Freq: Every day | ORAL | Status: AC

## 2014-06-23 NOTE — Telephone Encounter (Signed)
Called patient and advised new antibiotic called in. He did verbalize understanding that if he is no better or symptoms are worse he will need to go to ER or come in for evaluation.

## 2014-06-23 NOTE — Telephone Encounter (Signed)
Patient has severe COPD, was seen on 06/15/2014 for COPD exacerbation, given zpak, called today stating still feels tightness, cough, mucus production. Will send in Levaquin 500mg  to take for 10 days, If not better come into office/go to ER.

## 2014-08-31 ENCOUNTER — Ambulatory Visit (INDEPENDENT_AMBULATORY_CARE_PROVIDER_SITE_OTHER): Payer: Commercial Managed Care - HMO | Admitting: Internal Medicine

## 2014-08-31 ENCOUNTER — Encounter: Payer: Self-pay | Admitting: Internal Medicine

## 2014-08-31 VITALS — BP 128/72 | HR 88 | Temp 98.2°F | Resp 18 | Ht 69.5 in | Wt 134.0 lb

## 2014-08-31 DIAGNOSIS — R7309 Other abnormal glucose: Secondary | ICD-10-CM

## 2014-08-31 DIAGNOSIS — E785 Hyperlipidemia, unspecified: Secondary | ICD-10-CM | POA: Diagnosis not present

## 2014-08-31 DIAGNOSIS — R7303 Prediabetes: Secondary | ICD-10-CM

## 2014-08-31 DIAGNOSIS — Z79899 Other long term (current) drug therapy: Secondary | ICD-10-CM

## 2014-08-31 DIAGNOSIS — R030 Elevated blood-pressure reading, without diagnosis of hypertension: Secondary | ICD-10-CM

## 2014-08-31 DIAGNOSIS — E559 Vitamin D deficiency, unspecified: Secondary | ICD-10-CM

## 2014-08-31 LAB — MAGNESIUM: MAGNESIUM: 2.1 mg/dL (ref 1.5–2.5)

## 2014-08-31 LAB — BASIC METABOLIC PANEL WITH GFR
BUN: 11 mg/dL (ref 7–25)
CALCIUM: 9.7 mg/dL (ref 8.6–10.3)
CHLORIDE: 100 mmol/L (ref 98–110)
CO2: 33 mmol/L — ABNORMAL HIGH (ref 20–31)
Creat: 1.05 mg/dL (ref 0.70–1.25)
GFR, EST AFRICAN AMERICAN: 84 mL/min (ref >=60)
GFR, EST NON AFRICAN AMERICAN: 73 mL/min (ref >=60)
GLUCOSE: 82 mg/dL (ref 65–99)
POTASSIUM: 4.6 mmol/L (ref 3.5–5.3)
SODIUM: 141 mmol/L (ref 135–146)

## 2014-08-31 LAB — LIPID PANEL
Cholesterol: 134 mg/dL (ref 125–200)
HDL: 76 mg/dL (ref >=40)
LDL CALC: 46 mg/dL (ref <130)
TRIGLYCERIDES: 62 mg/dL (ref <150)
Total CHOL/HDL Ratio: 1.8 Ratio (ref <=5.0)
VLDL: 12 mg/dL (ref <30)

## 2014-08-31 LAB — HEPATIC FUNCTION PANEL
ALT: 16 U/L (ref 9–46)
AST: 27 U/L (ref 10–35)
Albumin: 4.3 g/dL (ref 3.6–5.1)
Alkaline Phosphatase: 75 U/L (ref 40–115)
BILIRUBIN INDIRECT: 0.6 mg/dL (ref 0.2–1.2)
Bilirubin, Direct: 0.2 mg/dL (ref <=0.2)
Total Bilirubin: 0.8 mg/dL (ref 0.2–1.2)
Total Protein: 6.8 g/dL (ref 6.1–8.1)

## 2014-08-31 LAB — CBC WITH DIFFERENTIAL/PLATELET
BASOS PCT: 1 % (ref 0–1)
Basophils Absolute: 0.1 10*3/uL (ref 0.0–0.1)
Eosinophils Absolute: 0.3 10*3/uL (ref 0.0–0.7)
Eosinophils Relative: 4 % (ref 0–5)
HCT: 41.4 % (ref 39.0–52.0)
Hemoglobin: 13.7 g/dL (ref 13.0–17.0)
LYMPHS PCT: 15 % (ref 12–46)
Lymphs Abs: 1.1 10*3/uL (ref 0.7–4.0)
MCH: 31.9 pg (ref 26.0–34.0)
MCHC: 33.1 g/dL (ref 30.0–36.0)
MCV: 96.3 fL (ref 78.0–100.0)
MPV: 11.3 fL (ref 8.6–12.4)
Monocytes Absolute: 0.9 10*3/uL (ref 0.1–1.0)
Monocytes Relative: 12 % (ref 3–12)
Neutro Abs: 5.1 10*3/uL (ref 1.7–7.7)
Neutrophils Relative %: 68 % (ref 43–77)
Platelets: 199 10*3/uL (ref 150–400)
RBC: 4.3 MIL/uL (ref 4.22–5.81)
RDW: 13.6 % (ref 11.5–15.5)
WBC: 7.5 10*3/uL (ref 4.0–10.5)

## 2014-08-31 LAB — HEMOGLOBIN A1C
HEMOGLOBIN A1C: 5.7 % — AB (ref <5.7)
MEAN PLASMA GLUCOSE: 117 mg/dL — AB (ref <117)

## 2014-08-31 LAB — TSH: TSH: 0.973 u[IU]/mL (ref 0.350–4.500)

## 2014-08-31 MED ORDER — BUDESONIDE-FORMOTEROL FUMARATE 160-4.5 MCG/ACT IN AERO: 2.0000 | INHALATION_SPRAY | Freq: Two times a day (BID) | RESPIRATORY_TRACT | Status: DC

## 2014-08-31 NOTE — Progress Notes (Signed)
Patient ID: [name], male   DOB: 1946-01-01, 69 y.o.   MRN: [id number]  Assessment and Plan:  Hypertension:  -Continue medication,  -monitor blood pressure at home.  -Continue DASH diet.   -Reminder to go to the ER if any CP, SOB, nausea, dizziness, severe HA, changes vision/speech, left arm numbness and tingling, and jaw pain.  Cholesterol: -Continue diet and exercise.  -Check cholesterol.   Pre-diabetes: -Continue diet and exercise.  -Check A1C  Vitamin D Def: -check level -continue medications.   Back pain -recommended f/u with Dr. Lynann Bologna only -we can increase cymbalta dose but patient would like to stay at current dose to see if pain continues to decrease  COPD -symbicort refilled and sample given -recommended increased exercise as tolerated -cont nebs as needed -quit smoking  Continue diet and meds as discussed. Further disposition pending results of labs.  HPI 69 y.o. male  presents for 3 month follow up with hypertension, hyperlipidemia, prediabetes and vitamin D.   His blood pressure has been controlled at home, today their BP is BP: 128/72 mmHg.   He does not workout. He denies chest pain, shortness of breath, dizziness. He reports that he has been trying to do some stuff on an exercise bike and a treadmill in his house.  He has a hard time with    He is on cholesterol medication and denies myalgias. His cholesterol is at goal. The cholesterol last visit was:   Lab Results  Component Value Date   CHOL 104 05/13/2014   HDL 64 05/13/2014   LDLCALC 27 05/13/2014   TRIG 66 05/13/2014   CHOLHDL 1.6 05/13/2014     He has been working on diet and exercise for prediabetes, and denies foot ulcerations, hyperglycemia, hypoglycemia , increased appetite, nausea, paresthesia of the feet, polydipsia, polyuria, visual disturbances, vomiting and weight loss. Last A1C in the office was:  Lab Results  Component Value Date   HGBA1C 5.8* 05/13/2014    Patient is on  Vitamin D supplement.  Lab Results  Component Value Date   VD25OH 67 05/13/2014     Patient reports that he has been having some pain with his degenerative disc disease and also the compression fracture and he reports that he is interested in seeing if there is another option to do with his back.  He reports that he was started on cymbalta by Dr. Annamaria Boots.  He does report that he still has a 2/10 pain especially when he is on his feet for a long time.  He reports that he cannot do golf or any actvities that he wants to do due to back pain and breathing issues.    Current Medications:  Current Outpatient Prescriptions on File Prior to Visit  Medication Sig Dispense Refill  . albuterol (PROVENTIL HFA;VENTOLIN HFA) 108 (90 BASE) MCG/ACT inhaler Inhale 2 puffs into the lungs every 6 (six) hours as needed for wheezing or shortness of breath. 1 Inhaler 6  . aspirin 81 MG tablet Take 81 mg by mouth daily.    Marland Kitchen atorvastatin (LIPITOR) 40 MG tablet Take 40 mg by mouth daily.     . budesonide-formoterol (SYMBICORT) 160-4.5 MCG/ACT inhaler Inhale 2 puffs into the lungs 2 (two) times daily. rinse mouth 1 Inhaler 5  . Cholecalciferol (VITAMIN D-3 PO) Take 5,000 Units by mouth daily.    . DULoxetine (CYMBALTA) 30 MG capsule 1 x 3 days, then 2 daily for pain 60 capsule 11  . HYDROcodone-acetaminophen (NORCO) 5-325 MG per tablet  Take 1 tablet by mouth every 6 (six) hours as needed for moderate pain. 60 tablet 0  . ibuprofen (ADVIL,MOTRIN) 200 MG tablet Take 400 mg by mouth every 6 (six) hours as needed for headache or moderate pain.     Marland Kitchen ipratropium-albuterol (DUONEB) 0.5-2.5 (3) MG/3ML SOLN Take 3 mLs by nebulization every 4 (four) hours as needed. DX COPD J44.9 225 mL 5  . methocarbamol (ROBAXIN) 500 MG tablet Take 500 mg by mouth 2 (two) times daily as needed for muscle spasms.      No current facility-administered medications on file prior to visit.    Medical History:  Past Medical History  Diagnosis  Date  . Epistaxis age 87    after blunt facial trauma. needed surgical clamps and 40 units of blood  . COPD (chronic obstructive pulmonary disease)   . Hyperlipidemia   . DDD (degenerative disc disease)   . Unspecified vitamin D deficiency   . Elevated blood pressure reading without diagnosis of hypertension   . DJD (degenerative joint disease)   . Colon polyps   . Asthma   . Osteoporosis   . Oxygen deficiency     2 LITERS, STARTED 3-4 MONTHS AGO    Allergies: No Known Allergies   Review of Systems:  Review of Systems  Constitutional: Negative for fever and chills.  HENT: Positive for congestion. Negative for ear pain and sore throat.   Eyes: Negative.   Respiratory: Positive for shortness of breath and wheezing. Negative for cough.   Cardiovascular: Negative for chest pain, palpitations and leg swelling.  Gastrointestinal: Positive for constipation. Negative for heartburn, diarrhea, blood in stool and melena.  Genitourinary: Negative.   Skin: Negative.   Neurological: Negative for dizziness, sensory change, loss of consciousness and headaches.  Psychiatric/Behavioral: Negative for depression. The patient is not nervous/anxious and does not have insomnia.     Family history- Review and unchanged  Social history- Review and unchanged  Physical Exam: BP 128/72 mmHg  Pulse 88  Temp(Src) 98.2 F (36.8 C) (Temporal)  Resp 18  Ht 5' 9.5" (1.765 m)  Wt 134 lb (60.782 kg)  BMI 19.51 kg/m2  SpO2 97% Wt Readings from Last 3 Encounters:  08/31/14 134 lb (60.782 kg)  06/15/14 139 lb (63.05 kg)  05/13/14 137 lb (62.143 kg)    General Appearance: Very thin, appears older than stated age, in no apparent distress. Eyes: PERRLA, EOMs, conjunctiva no swelling or erythema ENT/Mouth: Ear canals normal without obstruction, swelling, erythma, discharge.  TMs normal bilaterally.  Oropharynx moist, clear, without exudate, or postoropharyngeal swelling. Neck: Supple, thyroid normal,no  cervical adenopathy  Respiratory: Respiratory effort normal, Breath sounds distant but clear A&P without rhonchi, or rale.  Occasional end expiratory wheeze that is scattered. No retractions, no accessory usage. Cardio: RRR with no MRGs. Brisk peripheral pulses without edema.  Abdomen: Soft, + BS,  Non tender, no guarding, rebound, hernias, masses. Musculoskeletal: Full ROM, 5/5 strength, Normal gait Skin: Warm, dry without rashes, lesions, ecchymosis.  Neuro: Awake and oriented X 3, Cranial nerves intact. Normal muscle tone, no cerebellar symptoms. Psych: Normal affect, Insight and Judgment appropriate.    Starlyn Skeans, PA-C 9:55 AM [facility]

## 2014-09-01 LAB — VITAMIN D 25 HYDROXY (VIT D DEFICIENCY, FRACTURES): Vit D, 25-Hydroxy: 71 ng/mL (ref 30–100)

## 2014-09-01 LAB — INSULIN, RANDOM: INSULIN: 4.3 u[IU]/mL (ref 2.0–19.6)

## 2014-09-07 ENCOUNTER — Other Ambulatory Visit: Payer: Self-pay | Admitting: Internal Medicine

## 2014-09-15 ENCOUNTER — Telehealth: Payer: Self-pay | Admitting: Internal Medicine

## 2014-09-15 MED ORDER — IPRATROPIUM-ALBUTEROL 0.5-2.5 (3) MG/3ML IN SOLN
RESPIRATORY_TRACT | Status: DC
Start: 2014-09-15 — End: 2014-11-10

## 2014-09-15 NOTE — Telephone Encounter (Signed)
Per patient, he received 1 box of Neb medication and needs refill Rx sent to pharmacy Patient notified Nothing further needed.\

## 2014-10-08 ENCOUNTER — Other Ambulatory Visit: Payer: Self-pay | Admitting: Physician Assistant

## 2014-11-04 ENCOUNTER — Ambulatory Visit: Payer: Commercial Managed Care - HMO | Admitting: Internal Medicine

## 2014-11-09 ENCOUNTER — Other Ambulatory Visit: Payer: Self-pay | Admitting: Internal Medicine

## 2014-11-09 ENCOUNTER — Ambulatory Visit (INDEPENDENT_AMBULATORY_CARE_PROVIDER_SITE_OTHER)
Admission: RE | Admit: 2014-11-09 | Discharge: 2014-11-09 | Disposition: A | Discharge status: Home / Self Care | Payer: Commercial Managed Care - HMO | Source: Ambulatory Visit | Attending: Internal Medicine | Admitting: Internal Medicine

## 2014-11-09 DIAGNOSIS — R918 Other nonspecific abnormal finding of lung field: Secondary | ICD-10-CM

## 2014-11-13 ENCOUNTER — Other Ambulatory Visit: Payer: Self-pay | Admitting: Internal Medicine

## 2014-11-13 DIAGNOSIS — R918 Other nonspecific abnormal finding of lung field: Secondary | ICD-10-CM

## 2014-12-01 ENCOUNTER — Other Ambulatory Visit: Payer: Self-pay | Admitting: Internal Medicine

## 2014-12-01 ENCOUNTER — Other Ambulatory Visit: Payer: Self-pay | Admitting: Physician Assistant

## 2014-12-02 ENCOUNTER — Telehealth: Payer: Self-pay | Admitting: Internal Medicine

## 2014-12-02 MED ORDER — IPRATROPIUM-ALBUTEROL 0.5-2.5 (3) MG/3ML IN SOLN: RESPIRATORY_TRACT | Status: DC

## 2014-12-02 NOTE — Telephone Encounter (Signed)
Called and spoke with pt  Pt requesting a refill of duoneb be sent to his pharmacy Advised pt that refill would be sent to pharmacy Pharmacy verified with pt  Refill sent electronically to pharmacy  Nothing further is needed

## 2014-12-02 NOTE — Telephone Encounter (Signed)
Patient calling to get refill on Duoneb.  Refill has already been sent to [facility].  Patient notified. Nothing further needed. Closing encounter

## 2014-12-03 ENCOUNTER — Ambulatory Visit (INDEPENDENT_AMBULATORY_CARE_PROVIDER_SITE_OTHER): Payer: Commercial Managed Care - HMO | Admitting: Internal Medicine

## 2014-12-03 ENCOUNTER — Encounter: Payer: Self-pay | Admitting: Internal Medicine

## 2014-12-03 VITALS — BP 126/74 | HR 92 | Temp 98.4°F | Resp 18 | Ht 69.5 in | Wt 140.0 lb

## 2014-12-03 DIAGNOSIS — E785 Hyperlipidemia, unspecified: Secondary | ICD-10-CM

## 2014-12-03 DIAGNOSIS — Z23 Encounter for immunization: Secondary | ICD-10-CM | POA: Diagnosis not present

## 2014-12-03 DIAGNOSIS — Z79899 Other long term (current) drug therapy: Secondary | ICD-10-CM

## 2014-12-03 DIAGNOSIS — J439 Emphysema, unspecified: Secondary | ICD-10-CM | POA: Diagnosis not present

## 2014-12-03 DIAGNOSIS — E559 Vitamin D deficiency, unspecified: Secondary | ICD-10-CM

## 2014-12-03 DIAGNOSIS — R030 Elevated blood-pressure reading, without diagnosis of hypertension: Secondary | ICD-10-CM | POA: Diagnosis not present

## 2014-12-03 DIAGNOSIS — R7303 Prediabetes: Secondary | ICD-10-CM | POA: Diagnosis not present

## 2014-12-03 DIAGNOSIS — Z6820 Body mass index (BMI) 20.0-20.9, adult: Secondary | ICD-10-CM | POA: Diagnosis not present

## 2014-12-03 LAB — BASIC METABOLIC PANEL WITH GFR
BUN: 14 mg/dL (ref 7–25)
CALCIUM: 9.6 mg/dL (ref 8.6–10.3)
CO2: 31 mmol/L (ref 20–31)
CREATININE: 1.13 mg/dL (ref 0.70–1.25)
Chloride: 99 mmol/L (ref 98–110)
GFR, Est African American: 77 mL/min (ref >=60)
GFR, Est Non African American: 67 mL/min (ref >=60)
Glucose, Bld: 76 mg/dL (ref 65–99)
Potassium: 4.7 mmol/L (ref 3.5–5.3)
SODIUM: 140 mmol/L (ref 135–146)

## 2014-12-03 LAB — CBC WITH DIFFERENTIAL/PLATELET
BASOS PCT: 1 % (ref 0–1)
Basophils Absolute: 0.1 10*3/uL (ref 0.0–0.1)
EOS ABS: 0.3 10*3/uL (ref 0.0–0.7)
EOS PCT: 4 % (ref 0–5)
HCT: 41.2 % (ref 39.0–52.0)
Hemoglobin: 13.5 g/dL (ref 13.0–17.0)
LYMPHS ABS: 1.3 10*3/uL (ref 0.7–4.0)
Lymphocytes Relative: 16 % (ref 12–46)
MCH: 31.5 pg (ref 26.0–34.0)
MCHC: 32.8 g/dL (ref 30.0–36.0)
MCV: 96 fL (ref 78.0–100.0)
MONO ABS: 1.1 10*3/uL — AB (ref 0.1–1.0)
MONOS PCT: 14 % — AB (ref 3–12)
MPV: 11.4 fL (ref 8.6–12.4)
NEUTROS PCT: 65 % (ref 43–77)
Neutro Abs: 5.3 10*3/uL (ref 1.7–7.7)
PLATELETS: 186 10*3/uL (ref 150–400)
RBC: 4.29 MIL/uL (ref 4.22–5.81)
RDW: 13.2 % (ref 11.5–15.5)
WBC: 8.1 10*3/uL (ref 4.0–10.5)

## 2014-12-03 LAB — LIPID PANEL
CHOL/HDL RATIO: 1.7 ratio (ref <=5.0)
CHOLESTEROL: 119 mg/dL — AB (ref 125–200)
HDL: 71 mg/dL (ref >=40)
LDL CALC: 38 mg/dL (ref <130)
Triglycerides: 52 mg/dL (ref <150)
VLDL: 10 mg/dL (ref <30)

## 2014-12-03 LAB — HEPATIC FUNCTION PANEL
ALBUMIN: 4.1 g/dL (ref 3.6–5.1)
ALT: 19 U/L (ref 9–46)
AST: 36 U/L — AB (ref 10–35)
Alkaline Phosphatase: 93 U/L (ref 40–115)
BILIRUBIN DIRECT: 0.2 mg/dL (ref <=0.2)
BILIRUBIN TOTAL: 0.8 mg/dL (ref 0.2–1.2)
Indirect Bilirubin: 0.6 mg/dL (ref 0.2–1.2)
Total Protein: 6.6 g/dL (ref 6.1–8.1)

## 2014-12-03 LAB — HEMOGLOBIN A1C
HEMOGLOBIN A1C: 5.9 % — AB (ref <5.7)
Mean Plasma Glucose: 123 mg/dL — ABNORMAL HIGH (ref <117)

## 2014-12-03 LAB — TSH: TSH: 1.264 u[IU]/mL (ref 0.350–4.500)

## 2014-12-03 LAB — MAGNESIUM: MAGNESIUM: 2.1 mg/dL (ref 1.5–2.5)

## 2014-12-03 NOTE — Patient Instructions (Signed)

## 2014-12-04 LAB — INSULIN, RANDOM: Insulin: 2.7 u[IU]/mL (ref 2.0–19.6)

## 2014-12-04 LAB — VITAMIN D 25 HYDROXY (VIT D DEFICIENCY, FRACTURES): Vit D, 25-Hydroxy: 75 ng/mL (ref 30–100)

## 2014-12-05 ENCOUNTER — Encounter: Payer: Self-pay | Admitting: Internal Medicine

## 2014-12-05 DIAGNOSIS — Z6820 Body mass index (BMI) 20.0-20.9, adult: Secondary | ICD-10-CM | POA: Insufficient documentation

## 2014-12-05 NOTE — Progress Notes (Signed)
Patient ID: [name], male   DOB: Sep 16, 1945, 69 y.o.   MRN: [id number]   This very nice 69 y.o. MWM presents for 3 month follow up with Hypertension, Hyperlipidemia, Pre-Diabetes, End stage O2 Dependent COPD and Vitamin D Deficiency. Patient has been on O2 about 2 years for his ES COPD and reports resting O2 range 90-94% at rest , but quickly desats to 85-88% with walking short distances of 230-50 feet necessitating frequent rests to recuperate.    Patient has hx/o labile HTN and is monitored expectantly & BP has been controlled at home. Today's BP: 126/74 mmHg. Patient has had no complaints of any cardiac type chest pain, palpitations, dyspnea/orthopnea/PND, dizziness, claudication, or dependent edema.   Hyperlipidemia is controlled with diet & meds. Patient denies myalgias or other med SE's. Current Lipids are at goal with Cholesterol 119*; HDL 71; LDL 38; Triglycerides 52.   Also, the patient has history of PreDiabetes and has had no symptoms of reactive hypoglycemia, diabetic polys, paresthesias or visual blurring.  Current A1c is not at goal with A1c of  5.9%.   Further, the patient also has history of Vitamin D Deficiency and supplements vitamin D without any suspected side-effects. Last vitamin D was 12/03/2014: Vit D, 25-Hydroxy 75      Medication Sig  . albuterol  HFA  Inhaler w/spacer for rescue Inhale 2 puffs into the lungs every 6 (six) hours as needed for wheezing or shortness of breath.  Marland Kitchen aspirin 81 MG tablet Take 81 mg by mouth daily.  Marland Kitchen atorvastatin  40 MG tablet Take 40 mg by mouth daily.   Dellis Anes) 160-4.5  inhaler Inhale 2 puffs into the lungs 2 (two) times daily. rinse mouth  . VITAMIN D Take 5,000 Units by mouth.  . DULoxetine  30 MG capsule 1 x 3 days, then 2 daily for pain  . ibuprofen  200 MG tablet Take 400 mg by mouth every 6 (six) hours as needed for headache or moderate pain.   Marland Kitchen VITAMIN D Take 5,000 Units by mouth daily.  Lebron Quam 5-325  Take 1 tablet by mouth  every 6 (six) hours as needed for moderate pain.  . DUONEB 0.5-2.5 (3) MG/3ML SOLN USE ONE VIAL IN NEBULIZER EVERY 4 HOURS AS NEEDED  . methocarbamol  500 MG tablet Take 500 mg by mouth 2 (two) times daily as needed for muscle spasms.    No Known Allergies  PMHx:   Past Medical History  Diagnosis Date  . Epistaxis age 36    after blunt facial trauma. needed surgical clamps and 40 units of blood  . COPD (chronic obstructive pulmonary disease) ([place])   . Hyperlipidemia   . DDD (degenerative disc disease)   . Unspecified vitamin D deficiency   . Elevated blood pressure reading without diagnosis of hypertension   . DJD (degenerative joint disease)   . Colon polyps   . Asthma   . Osteoporosis   . Oxygen deficiency     2 LITERS, STARTED 3-4 MONTHS AGO   Immunization History  Administered Date(s) Administered  . Influenza Split 10/24/2011, 09/23/2012  . Influenza Whole 12/23/2008, 10/23/2009, 09/24/2010  . Influenza, High Dose Seasonal PF 10/06/2013, 12/03/2014  . Pneumococcal Conjugate-13 05/13/2014  . Pneumococcal Polysaccharide-23 10/23/2008, 09/15/2012   Past Surgical History  Procedure Laterality Date  . Bilateral knee arthroscopy    . Surgical hemostasis      in sinuses after [place]  . Prostate biopsy    . Back surgery  [id number]    disc replacement  . Colonoscopy     FHx:    Reviewed / unchanged  SHx:    Reviewed / unchanged  Systems Review:  Constitutional: Denies fever, chills, wt changes, headaches, insomnia, fatigue, night sweats, change in appetite. Eyes: Denies redness, blurred vision, diplopia, discharge, itchy, watery eyes.  ENT: Denies discharge, congestion, post nasal drip, epistaxis, sore throat, earache, hearing loss, dental pain, tinnitus, vertigo, sinus pain, snoring.  CV: Denies chest pain, palpitations, irregular heartbeat, syncope, dyspnea, diaphoresis, orthopnea, PND, claudication or edema. Respiratory: denies cough, dyspnea, DOE, pleurisy, hoarseness,  laryngitis, wheezing.  Gastrointestinal: Denies dysphagia, odynophagia, heartburn, reflux, water brash, abdominal pain or cramps, nausea, vomiting, bloating, diarrhea, constipation, hematemesis, melena, hematochezia  or hemorrhoids. Genitourinary: Denies dysuria, frequency, urgency, nocturia, hesitancy, discharge, hematuria or flank pain. Musculoskeletal: Denies arthralgias, myalgias, stiffness, jt. swelling, pain, limping or strain/sprain.  Skin: Denies pruritus, rash, hives, warts, acne, eczema or change in skin lesion(s). Neuro: No weakness, tremor, incoordination, spasms, paresthesia or pain. Psychiatric: Denies confusion, memory loss or sensory loss. Endo: Denies change in weight, skin or hair change.  Heme/Lymph: No excessive bleeding, bruising or enlarged lymph nodes.  Physical Exam  BP 126/74 mmHg  Pulse 92  Temp(Src) 98.4 F (36.9 C) (Temporal)  Resp 18  Ht 5' 9.5" (1.765 m)  Wt 140 lb (63.504 kg)  BMI 20.39 kg/m2  SpO2 94%  Appears well nourished and in no distress. Eyes: PERRLA, EOMs, conjunctiva no swelling or erythema. Sinuses: No frontal/maxillary tenderness ENT/Mouth: EAC's clear, TM's nl w/o erythema, bulging. Nares clear w/o erythema, swelling, exudates. Oropharynx clear without erythema or exudates. Oral hygiene is good. Tongue normal, non obstructing. Hearing intact.  Neck: Supple. Thyroid nl. Car 2+/2+ without bruits, nodes or JVD. Chest: Respirations distant with BS clear & equal w/o rales, rhonchi, wheezing or stridor.  Cor: Heart sounds normal w/ regular rate and rhythm without sig. murmurs, gallops, clicks, or rubs. Peripheral pulses normal and equal  without edema.  Abdomen: Soft & bowel sounds normal. Non-tender w/o guarding, rebound, hernias, masses, or organomegaly.  Lymphatics: Unremarkable.  Musculoskeletal: Full ROM all peripheral extremities, joint stability, 5/5 strength, and normal gait.  Skin: Warm, dry without exposed rashes, lesions or ecchymosis  apparent.  Neuro: Cranial nerves intact, reflexes equal bilaterally. Sensory-motor testing grossly intact. Tendon reflexes grossly intact.  Pysch: Alert & oriented x 3.  Insight and judgement nl & appropriate. No ideations.  Assessment and Plan:  1. Labile Hypertension  - TSH  2. Hyperlipidemia  - Lipid panel  3. Prediabetes  - Hemoglobin A1c - Insulin, random  4. Vitamin D deficiency  - Vit D  25 hydroxy  5. Need for prophylactic vaccination and inoculation against influenza  - Flu vaccine HIGH DOSE PF (Fluzone High dose)  6. Pulmonary emphysema, unspecified emphysema type ([place])   7. Body mass index (BMI) of 20.0-20.9 in adult   8. Medication management  - CBC with Differential/Platelet - BASIC METABOLIC PANEL WITH GFR - Hepatic function panel - Magnesium   Recommended regular exercise, BP monitoring, weight control, and discussed med and SE's. Recommended labs to assess and monitor clinical status. Further disposition pending results of labs. Over 30 minutes of exam, counseling, chart review was performed

## 2014-12-15 ENCOUNTER — Other Ambulatory Visit: Payer: Self-pay | Admitting: Internal Medicine

## 2014-12-30 ENCOUNTER — Other Ambulatory Visit: Payer: Self-pay | Admitting: Internal Medicine

## 2015-01-01 ENCOUNTER — Telehealth: Payer: Self-pay | Admitting: Internal Medicine

## 2015-01-01 MED ORDER — IPRATROPIUM-ALBUTEROL 0.5-2.5 (3) MG/3ML IN SOLN: 3.0000 mL | RESPIRATORY_TRACT | Status: DC | PRN

## 2015-01-01 NOTE — Telephone Encounter (Signed)
Pt requesting refill on Duoneb.  This has been sent to preferred pharmacy.  Nothing further needed.

## 2015-01-07 ENCOUNTER — Ambulatory Visit (INDEPENDENT_AMBULATORY_CARE_PROVIDER_SITE_OTHER): Payer: Commercial Managed Care - HMO | Admitting: Internal Medicine

## 2015-01-07 ENCOUNTER — Encounter: Payer: Self-pay | Admitting: Internal Medicine

## 2015-01-07 VITALS — BP 122/82 | HR 93 | Ht 69.5 in | Wt 141.6 lb

## 2015-01-07 DIAGNOSIS — F17200 Nicotine dependence, unspecified, uncomplicated: Secondary | ICD-10-CM

## 2015-01-07 DIAGNOSIS — R918 Other nonspecific abnormal finding of lung field: Secondary | ICD-10-CM | POA: Diagnosis not present

## 2015-01-07 DIAGNOSIS — J432 Centrilobular emphysema: Secondary | ICD-10-CM | POA: Diagnosis not present

## 2015-01-07 MED ORDER — IPRATROPIUM-ALBUTEROL 0.5-2.5 (3) MG/3ML IN SOLN: 3.0000 mL | RESPIRATORY_TRACT | Status: AC | PRN

## 2015-01-07 MED ORDER — BUDESONIDE-FORMOTEROL FUMARATE 160-4.5 MCG/ACT IN AERO: 2.0000 | INHALATION_SPRAY | Freq: Two times a day (BID) | RESPIRATORY_TRACT | Status: AC

## 2015-01-07 NOTE — Progress Notes (Signed)
Subjective:    Patient ID: [name], male    DOB: [birth year], 69 y.o.   MRN: [id number]  HPI 06/23/10- 56 yo tobacco user 1/2 ppd, followed for COPD   here with dtr. Last here October 27, 2009. Since then had back surgery -did ok. For last 2-3 weeks had been tighter through chest, difficult to clear thick mucus without fever, green or sore throat. Just feels he can't clear mucus. Has had 2 steroid shots for this this in past month. Taking doxycycline and just finished prednisone. Neb with albuterol opens him only transiently. DOE but comfortable at rest. Denies chest pain. Has had a chest CT at [facility]. CT w/cm 04/01/10- emphysema, NAD, left apical scarring, compression fx's T8, T12 Nuclear bone scan 04/27/10- uptake c/w T8 vertebral compression fx.  07/06/11- 23 yo tobacco user 1/2 ppd, followed for COPD   here with dtr.  Patient states about the same as last visit. c/o  occasional sob and wheezing. Denies chest pain and chest tightness.  He is still smoking 3 packs per week, but considers this improved. We discussed cessation again.  He reports 3 episodes of thrush, using Symbicort. Discussed mouth care and aerochamber. DOE limits stairs and brisk walk. Cough productive clear white. Denies chest pain, blood. Sensitive to weather, cold, humidity.  07/05/12- 8 yo smoker, followed for COPD  FOLLOWS FOR: gives out quickly if trying to do too much; SOB as well. Has to pace himself. Reduced cigarettes to 4 or 5 a day and trying to do better. Breathing limits exercise tolerance. Wheeze, cough, clear sputum. Prescription for prednisone taper from Dr Melford Aase he did not fill. CXR 05/16/12 IMPRESSION:  COPD with stable hyperinflation. No new focal or acute abnormality  identified  Original Report Authenticated By: Ponciano Ort, M.D. PFT 07/02/2009-severe emphysema-FEV1/FVC 0.33, DLCO 71%.  09/04/12- 39 yo smoker, followed for COPD  FOLLOWS FOR: pt denies any concerns at this time-- breathing is doing  well Feeling much better. Likes the theophylline but says it is a tier IV with his insurance. Felt claustrophobic doing PFT. 6MWT 07/23/12- 97%, 94%, 98%, 342 m. No oxygen limitation on this test. PFT- 07/23/12- . Severe obstructive airways disease with insignificant response to bronchodilator. Diffusion severely reduced. FEC 1.75/37%, FEV1 0.64/18%, FEV1/FVC 0.36/48%, FEF 25-75% 38/48% predicted. DLCO 44%.  03/06/13- 36 yo smoker, followed for COPD  FOLLOWS FOR:  Breathing doing well- no concerns today Dislikes the cold weather but no acute problem  07/21/13- 70 yo smoker, followed for COPD  FOLLOWS FOR:  Increased sob, wheezing over the past month  CXR 06/30/13 IMPRESSION:  COPD without acute abnormality.  Electronically Signed  By: Franchot Gallo M.D.  On: 06/30/2013 10:31   09/03/13-67 yo smoker, followed for COPD  FOLLOWS FOR: Pt states he uses his O2 occasionally; could not tell a difference with using O2. Recent CT abd noted 5 mm RLL nodule not mentioned on report from CT chest 2012. Also noted atherosclerosis.   11/03/13 -79 yo smoker, followed for COPD, Lung nodule, complicated by atherosclerosis  FOLLOWS FOR: Gives out quickly when up moving around-recently went on golfing trip and could tell the breathing is off when swinging his club. We had given samples of Anoro and Qvar 80 to compare with Symbicort  01/29/14- 72 yo smoker, followed for COPD Seen in hospital 01/11/14 for PNA, pt reports that SOB and cough is improved. pt states that he was having some abd pain right below rib cage and in lower back - pt states  this comes and goes and is corrected with muscle relaxers and pain pills, and by lying on his stomach. He blames Lipitor for this, not theophylline. Abd CT in October for intermittent pain showed lung nodule and extensive atherosclerosis. Very little cough. No night sweats. Continues oxygen 2 L for sleep. Treated at ER 01/10/2014-pneumonia. Recent physical exam by his primary  physician "okay". Theophylline definitely helps his breathing. He thinks it is well tolerated. CT chest 01/10/14 IMPRESSION: No evidence of pulmonary embolism. Scattered tree-in-bud nodularity with mild ground-glass opacity and bronchial wall thickening in the right middle lobe and bilateral lower lobes, likely reflecting mild bronchopneumonia. Underlying moderate emphysematous changes. No frank interstitial edema. Mild thoracic lymphadenopathy, favored to be reactive. Electronically Signed  By: Julian Hy M.D.  On: 01/10/2014 14:38  02/23/14- 56 yo smoker, followed for COPD, lung nodules, complicated by atherosclerosis/CAD/PAD, abdominal pain      daughter is here His primary physician did express concern about December CT scan showing changes of bronchiolitis but also groundglass opacities which sometimes turn out to be early cancers. He denies weight loss, fever or sweat. Mild cough is dry. He now localizes abdominal pain to the area just below his navel but says it moves through to his low back such that lying down on either his stomach or back feel better. Recent colonoscopy. He went to ER January 29 for this pain. Using oxygen 2 L for sleep/[place] He researched fibromyalgia and says "it sounds like what I've got". He would like to try Cymbalta for this pain based on experience of somebody he knows with fibromyalgia. We again reviewed results of CT abdomen from last fall and CT chest from one month ago.  05/05/14- 23 yo smoker, followed for COPD, lung nodules, complicated by atherosclerosis/CAD/PAD, abdominal pain      daughter is here FOLLOWS FOR: SOB continues with exertion; order has been placed for non contrast CT chest for 6 months from today. Cymbalta has significantly helped his back pain. 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 Continues to lose weight but he says in just the last week and a half he has started eating much better. We discussed a local trial studying  impact of ensure food supplements on COPD stability. PET  03/04/14-  IMPRESSION: 1. 4 mm left lower lobe pulmonary nodule is below the resolution of PET. A smaller left lower lobe nodule is also indeterminate and below PET resolution. Both or similar back to 01/10/2014. Per consensus criteria, followup with chest CT at 1 year is recommended. This recommendation follows the consensus statement: Guidelines for Management of Small Pulmonary Nodules Detected on CT Scans: A Statement from the [place] as published in Radiology 2005; 237:395-400. 2. Atherosclerosis, including within the coronary arteries. 3. Mild hypermetabolism corresponding to a mild T9 vertebral compression fracture. 4. Emphysema. Electronically Signed  By: Abigail Miyamoto M.D.  On: 03/04/2014 09:39 CTchest 05/04/14 IMPRESSION: 1. The small left lower lobe pulmonary nodules are unchanged from CT of 4 months ago, although 1 has enlarged from 2012. No new nodules demonstrated. 2. Interval resolution of mediastinal and hilar lymphadenopathy. 3. Emphysema without evidence of residual inflammatory change. 4. Given risk factors for bronchogenic carcinoma, follow-up chest CT at 6-12 months is recommended. This recommendation follows the consensus statement: Guidelines for Management of Small Pulmonary Nodules Detected on CT Scans: A Statement from the [place] as published in Radiology 2005;237:395-400. Electronically Signed  By: Richardean Sale M.D.  On: 05/04/2014 09:27  01/07/2015-69 yo smoker, followed for COPD, lung nodules, complicated by  atherosclerosis/CAD/PAD, abdominal pain      daughter is here Follows for: lung nodules. Pt c/o continued SOB with exertion. Pt states that if he stops and rests he does recover quickly. Pt denies wheeze/CP/tightness.  Flu and pneumonia vaccines up-to-date Little cough. His weight is going up which please send. Holds his smoking down to between 1 and 5  cigarettes daily. We reviewed images together CT chest 11/09/2014 IMPRESSION: New 4 mm left lower lobe nodule. Additional left lower lobe nodules measuring up to 4 mm are unchanged. Follow-up CT chest is suggested in 6 months. This recommendation follows the consensus statement: Guidelines for Management of Small Pulmonary Nodules Detected on CT Scans: A Statement from the [place] as published in Radiology 2005; [id number] Electronically Signed  By: Julian Hy M.D.  On: 11/09/2014 10:04  ROS-see HPI Constitutional:   No-   weight loss, night sweats, fevers, chills, fatigue, lassitude. HEENT:   No-  headaches, difficulty swallowing, tooth/dental problems, sore throat,       No-  sneezing, itching, ear ache, nasal congestion, post nasal drip,  CV:  No-   chest pain, orthopnea, PND, swelling in lower extremities, anasarca, dizziness, palpitations Resp: + shortness of breath with exertion or at rest.             No- productive cough,  No non-productive cough,  No- coughing up of blood.              No-   change in color of mucus.  No-wheezing.   Skin: No-   rash or lesions. GI:   +Intermittent abdominal pain as described GU: No dysuria MS:  No-   joint pain or swelling.   Neuro-     nothing unusual Psych:  No- change in mood or affect. No depression or anxiety.  No memory loss.  OBJ- Physical Exam     O2 2L-sleep General- Alert, Oriented, Affect-appropriate, Distress- none acute, slender Skin- rash-none, lesions- none, excoriation- none Lymphadenopathy- none Head- atraumatic            Eyes- Gross vision intact, PERRLA, conjunctivae and secretions clear            Ears- Hearing, canals-normal            Nose- clear, no-Septal dev, mucus, polyps, erosion, perforation             Throat- Mallampati II , mucosa clear , drainage- none, tonsils- atrophic Neck- flexible , trachea midline, no stridor , thyroid nl, carotid no bruit Chest - symmetrical excursion ,  unlabored           Heart/CV- RRR , no murmur , no gallop  , no rub, nl s1 s2                           - JVD- none , edema- none, stasis changes- none, varices- none           Lung- +distant, wheeze- none, cough- none , dullness-none, rub- none           Chest wall-  Abd-   Br/ Gen/ Rectal- Not done, not indicated Extrem- cyanosis- none, clubbing, none, atrophy- none, strength- nl Neuro- grossly intact to observation

## 2015-01-07 NOTE — Patient Instructions (Addendum)
Order- schedule future CT chest no contrast  Dx lung nodules    To be done in April, 2017  Refill scripts printed for respiratory meds  Please call as needed

## 2015-01-09 NOTE — Assessment & Plan Note (Signed)
Paces himself appropriately.

## 2015-01-09 NOTE — Assessment & Plan Note (Signed)
Again encouraged to quit completely

## 2015-01-09 NOTE — Assessment & Plan Note (Signed)
We will repeat CT chest in April cracking small nodules

## 2015-01-14 ENCOUNTER — Other Ambulatory Visit: Payer: Self-pay | Admitting: Cardiovascular Disease

## 2015-01-14 ENCOUNTER — Other Ambulatory Visit: Payer: Self-pay | Admitting: *Deleted

## 2015-01-14 MED ORDER — DULOXETINE HCL 30 MG PO CPEP
ORAL_CAPSULE | ORAL | Status: DC
Start: 2015-01-14 — End: 2015-01-14

## 2015-01-14 MED ORDER — ATORVASTATIN CALCIUM 40 MG PO TABS: 40.0000 mg | ORAL_TABLET | Freq: Every day | ORAL | Status: AC

## 2015-01-14 MED ORDER — ATORVASTATIN CALCIUM 40 MG PO TABS: 40.0000 mg | ORAL_TABLET | Freq: Every day | ORAL | Status: DC

## 2015-01-14 MED ORDER — DULOXETINE HCL 30 MG PO CPEP: ORAL_CAPSULE | ORAL | Status: DC

## 2015-01-14 MED ORDER — DULOXETINE HCL 30 MG PO CPEP
ORAL_CAPSULE | ORAL | Status: DC
Start: 2015-01-14 — End: 2015-04-13

## 2015-01-14 NOTE — Telephone Encounter (Signed)
atorvastatin (LIPITOR) 40 MG tablet  Medication   Date: 01/14/2015  Department: [facility]  Ordering/Authorizing: Unk Pinto, MD      Order Providers    Prescribing Provider Encounter Provider   Unk Pinto, MD Melbourne Abts, RN    Medication Detail      Disp Refills Start End     atorvastatin (LIPITOR) 40 MG tablet 90 tablet 1 01/14/2015     Sig - Route: Take 1 tablet (40 mg total) by mouth daily. - Oral    Class: Print     Pharmacy    [facility] [place], [place] [id number]   Already filled

## 2015-02-01 ENCOUNTER — Encounter: Payer: Self-pay | Admitting: Physician Assistant

## 2015-03-26 ENCOUNTER — Encounter: Payer: Self-pay | Admitting: Gastroenterology

## 2015-04-13 ENCOUNTER — Ambulatory Visit (INDEPENDENT_AMBULATORY_CARE_PROVIDER_SITE_OTHER): Payer: PPO | Admitting: Internal Medicine

## 2015-04-13 ENCOUNTER — Encounter: Payer: Self-pay | Admitting: Internal Medicine

## 2015-04-13 VITALS — BP 142/88 | HR 88 | Temp 97.5°F | Resp 28 | Ht 69.0 in | Wt 134.6 lb

## 2015-04-13 DIAGNOSIS — Z79899 Other long term (current) drug therapy: Secondary | ICD-10-CM

## 2015-04-13 DIAGNOSIS — J439 Emphysema, unspecified: Secondary | ICD-10-CM

## 2015-04-13 DIAGNOSIS — R6889 Other general symptoms and signs: Secondary | ICD-10-CM | POA: Diagnosis not present

## 2015-04-13 DIAGNOSIS — E785 Hyperlipidemia, unspecified: Secondary | ICD-10-CM | POA: Diagnosis not present

## 2015-04-13 DIAGNOSIS — E559 Vitamin D deficiency, unspecified: Secondary | ICD-10-CM | POA: Diagnosis not present

## 2015-04-13 DIAGNOSIS — Z125 Encounter for screening for malignant neoplasm of prostate: Secondary | ICD-10-CM

## 2015-04-13 DIAGNOSIS — Z0001 Encounter for general adult medical examination with abnormal findings: Secondary | ICD-10-CM

## 2015-04-13 DIAGNOSIS — Z1212 Encounter for screening for malignant neoplasm of rectum: Secondary | ICD-10-CM

## 2015-04-13 DIAGNOSIS — I739 Peripheral vascular disease, unspecified: Secondary | ICD-10-CM

## 2015-04-13 DIAGNOSIS — R030 Elevated blood-pressure reading, without diagnosis of hypertension: Secondary | ICD-10-CM | POA: Diagnosis not present

## 2015-04-13 DIAGNOSIS — R7303 Prediabetes: Secondary | ICD-10-CM

## 2015-04-13 DIAGNOSIS — G894 Chronic pain syndrome: Secondary | ICD-10-CM

## 2015-04-13 LAB — LIPID PANEL
CHOLESTEROL: 130 mg/dL (ref 125–200)
HDL: 75 mg/dL (ref >=40)
LDL Cholesterol: 45 mg/dL (ref <130)
Total CHOL/HDL Ratio: 1.7 Ratio (ref <=5.0)
Triglycerides: 51 mg/dL (ref <150)
VLDL: 10 mg/dL (ref <30)

## 2015-04-13 LAB — CBC WITH DIFFERENTIAL/PLATELET
BASOS PCT: 1 % (ref 0–1)
Basophils Absolute: 0.1 10*3/uL (ref 0.0–0.1)
EOS ABS: 0.3 10*3/uL (ref 0.0–0.7)
Eosinophils Relative: 4 % (ref 0–5)
HCT: 40.8 % (ref 39.0–52.0)
HEMOGLOBIN: 13.1 g/dL (ref 13.0–17.0)
LYMPHS ABS: 1.2 10*3/uL (ref 0.7–4.0)
Lymphocytes Relative: 18 % (ref 12–46)
MCH: 31.4 pg (ref 26.0–34.0)
MCHC: 32.1 g/dL (ref 30.0–36.0)
MCV: 97.8 fL (ref 78.0–100.0)
MONO ABS: 1.1 10*3/uL — AB (ref 0.1–1.0)
MONOS PCT: 16 % — AB (ref 3–12)
MPV: 11.8 fL (ref 8.6–12.4)
NEUTROS PCT: 61 % (ref 43–77)
Neutro Abs: 4.1 10*3/uL (ref 1.7–7.7)
PLATELETS: 203 10*3/uL (ref 150–400)
RBC: 4.17 MIL/uL — ABNORMAL LOW (ref 4.22–5.81)
RDW: 13.4 % (ref 11.5–15.5)
WBC: 6.7 10*3/uL (ref 4.0–10.5)

## 2015-04-13 LAB — TSH: TSH: 1.03 m[IU]/L (ref 0.40–4.50)

## 2015-04-13 LAB — BASIC METABOLIC PANEL WITH GFR
BUN: 14 mg/dL (ref 7–25)
CALCIUM: 9.7 mg/dL (ref 8.6–10.3)
CHLORIDE: 98 mmol/L (ref 98–110)
CO2: 30 mmol/L (ref 20–31)
CREATININE: 1.17 mg/dL (ref 0.70–1.25)
GFR, Est African American: 74 mL/min (ref >=60)
GFR, Est Non African American: 64 mL/min (ref >=60)
Glucose, Bld: 73 mg/dL (ref 65–99)
Potassium: 4.5 mmol/L (ref 3.5–5.3)
Sodium: 138 mmol/L (ref 135–146)

## 2015-04-13 LAB — HEPATIC FUNCTION PANEL
ALBUMIN: 4.3 g/dL (ref 3.6–5.1)
ALT: 19 U/L (ref 9–46)
AST: 36 U/L — AB (ref 10–35)
Alkaline Phosphatase: 93 U/L (ref 40–115)
Bilirubin, Direct: 0.2 mg/dL (ref <=0.2)
Indirect Bilirubin: 0.6 mg/dL (ref 0.2–1.2)
Total Bilirubin: 0.8 mg/dL (ref 0.2–1.2)
Total Protein: 7.3 g/dL (ref 6.1–8.1)

## 2015-04-13 LAB — MAGNESIUM: MAGNESIUM: 2.2 mg/dL (ref 1.5–2.5)

## 2015-04-13 MED ORDER — DULOXETINE HCL 60 MG PO CPEP: ORAL_CAPSULE | ORAL | Status: AC

## 2015-04-13 NOTE — Progress Notes (Signed)
Patient ID: [name], male   DOB: [birth year], 70 y.o.   MRN: [id number]  Annual  Screening/Preventative Visit And Comprehensive Evaluation & Examination  This very nice 70 y.o. MWM presents for a Wellness/Preventative Visit & comprehensive evaluation and management of multiple medical co-morbidities.  Patient has been followed for labile HTN, Prediabetes, Hyperlipidemia and Vitamin D Deficiency.Patient also has Gold 4 COPD and O2 Dependent since 2015 with O2 sat on oxygen at rest in the 90-91% range, but if he ambulates even short distances 25 feet he quickly desat's to 85-86%. He admits cough occasionally productive of a clear mucus. He is followed by Dr Keturah Barre for pulmonary.    Patient has hx/o elevated BP and is proactively monitored expectantly. Patient's BP has been controlled at home.Today's BP is sl elevated at 142/88. Patient denies any cardiac symptoms as chest pain, palpitations, shortness of breath, dizziness or ankle swelling.   Patient's hyperlipidemia is controlled with diet and medications. Patient denies myalgias or other medication SE's. Last lipids were at goal with Cholesterol 119*; HDL 71; LDL 38; Triglycerides 52 on 12/03/2014.   Patient has prediabetes since 2016 with A1c 6.3% in Jan 2016  and patient denies reactive hypoglycemic symptoms, visual blurring, diabetic polys or paresthesias. Last A1c was 5.9% on 12/03/2014.   Finally, patient has history of Vitamin D Deficiency of 27 in 2014 and 34 in Jan 2016 and last vitamin D was 75 on 12/03/2014.  Medication Sig  . albuterol  HFA inhaler Inhale 2 puffs into the lungs every 6 (six) hours as needed for wheezing or shortness of breath.  Marland Kitchen aspirin 81 MG tablet Take 81 mg by mouth daily.  Marland Kitchen atorvastatin40 MG tablet Take 1 tablet (40 mg total) by mouth daily.  . SYMBICORT 160-4.5  inhaler Inhale 2 puffs into the lungs 2 (two) times daily. rinse mouth  . VITAMIN D Take 5,000 Units by mouth.  . DULoxetine 30 MG capsule take 2  capsules daily for pain  . ibuprofen  200 MG tablet Take 400 mg by mouth every 6 (six) hours as needed for headache or moderate pain.   . DUONEB 0.5-2.5  MG/3ML SOLN Take 3 mLs by nebulization every 4 (four) hours as needed.   No Known Allergies   Past Medical History  Diagnosis Date  . Epistaxis age 70    after blunt facial trauma. needed surgical clamps and 40 units of blood  . COPD (chronic obstructive pulmonary disease) ([place])   . Hyperlipidemia   . DDD (degenerative disc disease)   . Unspecified vitamin D deficiency   . Elevated blood pressure reading without diagnosis of hypertension   . DJD (degenerative joint disease)   . Colon polyps   . Asthma   . Osteoporosis   . Oxygen deficiency     2 LITERS, STARTED 3-4 MONTHS AGO   Health Maintenance  Topic Date Due  . Hepatitis C Screening  1945/06/03  . ZOSTAVAX  05/14/2007  . TETANUS/TDAP  01/24/2015  . COLONOSCOPY  02/19/2015  . INFLUENZA VACCINE  08/24/2015  . PNA vac Low Risk Adult  Completed   Immunization History  Administered Date(s) Administered  . Influenza Split 10/24/2011, 09/23/2012  . Influenza Whole 12/23/2008, 10/23/2009, 09/24/2010  . Influenza, High Dose Seasonal PF 10/06/2013, 12/03/2014  . Pneumococcal Conjugate-13 05/13/2014  . Pneumococcal Polysaccharide-23 10/23/2008, 09/15/2012   Past Surgical History  Procedure Laterality Date  . Bilateral knee arthroscopy    . Surgical hemostasis      in sinuses  after [place]  . Prostate biopsy    . Back surgery  04-2010    disc replacement  . Colonoscopy     Family History  Problem Relation Age of Onset  . Stroke Father   . Hyperlipidemia Father   . Heart disease Father 7    deceased with cerebral aneurysm  . Heart disease Mother   . Lung disease Mother   . Asthma Mother   . Hypertension Mother   . Hyperlipidemia Mother   . Colon cancer Neg Hx   . Diabetes Sister   . Colon polyps Brother   . Leukemia Sister     Social History   Social History   . Marital Status: Married    Spouse Name: N/A  . Number of Children: 4  . Years of Education: N/A   Occupational History  . Retired    Social History Main Topics  . Smoking status: Current Every Day Smoker    Types: Cigarettes  . Smokeless tobacco: Never Used     Comment: 5 CIGS DAILY  . Alcohol Use: No  . Drug Use: No  . Sexual Activity: Not on file    ROS Constitutional: Denies fever, chills, weight loss/gain, headaches, insomnia,  night sweats or change in appetite. Does c/o fatigue. Eyes: Denies redness, blurred vision, diplopia, discharge, itchy or watery eyes.  ENT: Denies discharge, congestion, post nasal drip, epistaxis, sore throat, earache, hearing loss, dental pain, Tinnitus, Vertigo, Sinus pain or snoring.  Cardio: Denies chest pain, palpitations, irregular heartbeat, syncope, dyspnea, diaphoresis, orthopnea, PND, claudication or edema Respiratory: denies cough, dyspnea, DOE, pleurisy, hoarseness, laryngitis or wheezing.  Gastrointestinal: Denies dysphagia, heartburn, reflux, water brash, pain, cramps, nausea, vomiting, bloating, diarrhea, constipation, hematemesis, melena, hematochezia, jaundice or hemorrhoids Genitourinary: Denies dysuria, frequency, urgency, nocturia, hesitancy, discharge, hematuria or flank pain Musculoskeletal: Denies arthralgia, myalgia, stiffness, Jt. Swelling, pain, limp or strain/sprain. Denies Falls. Skin: Denies puritis, rash, hives, warts, acne, eczema or change in skin lesion Neuro: No weakness, tremor, incoordination, spasms, paresthesia or pain Psychiatric: Denies confusion, memory loss or sensory loss. Denies Depression. Endocrine: Denies change in weight, skin, hair change, nocturia, and paresthesia, diabetic polys, visual blurring or hyper / hypo glycemic episodes.  Heme/Lymph: No excessive bleeding, bruising or enlarged lymph nodes.  Physical Exam  BP 142/88 mmHg  Pulse 88  Temp(Src) 97.5 F (36.4 C)  Resp 28  Ht 5\' 9"  (1.753 m)   Wt 134 lb 9.6 oz (61.054 kg)  BMI 19.87 kg/m2  General Appearance: Well nourished, in no apparent distress. Eyes: PERRLA, EOMs, conjunctiva no swelling or erythema, normal fundi and vessels. Sinuses: No frontal/maxillary tenderness ENT/Mouth: EACs patent / TMs  nl. Nares clear without erythema, swelling, mucoid exudates. Oral hygiene is good. No erythema, swelling, or exudate. Tongue normal, non-obstructing. Tonsils not swollen or erythematous. Hearing normal.  Neck: Supple, thyroid normal. No bruits, nodes or JVD. Respiratory: Respiratory effort normal.  BS equal and clear bilateral without rales, rhonci, wheezing or stridor. Cardio: Heart sounds are normal with regular rate and rhythm and no murmurs, rubs or gallops. Peripheral pulses are normal and equal bilaterally without edema. No aortic or femoral bruits. Chest: symmetric with normal excursions and percussion.  Abdomen: Soft, with Nl bowel sounds. Nontender, no guarding, rebound, hernias, masses, or organomegaly.  Lymphatics: Non tender without lymphadenopathy.  Genitourinary: No hernias.Testes nl. DRE - prostate nl for age - smooth & firm w/o nodules. Musculoskeletal: Full ROM all peripheral extremities, joint stability, 5/5 strength, and normal gait. Skin: Warm  and dry without rashes, lesions, cyanosis, clubbing or  ecchymosis.  Neuro: Cranial nerves intact, reflexes equal bilaterally. Normal muscle tone, no cerebellar symptoms. Sensation intact.  Pysch: Alert and oriented X 3 with normal affect, insight and judgment appropriate.   Assessment and Plan  1. Annual Preventative/Screening Exam    - Microalbumin / creatinine urine ratio - EKG 12-Lead - [place], RETROPERITNL ABD,  LTD - POC Hemoccult Bld/Stl  - Urinalysis, Routine w reflex microscopic  - PSA - CBC with Differential/Platelet - BASIC METABOLIC PANEL WITH GFR - Hepatic function panel - Magnesium - Lipid panel - TSH - Hemoglobin A1c - Insulin, random - VITAMIN D 25  Hydroxy   2. Labile Hypertension  - Microalbumin / creatinine urine ratio - EKG 12-Lead - [place], RETROPERITNL ABD,  LTD - TSH  3. Hyperlipidemia  - Lipid panel - TSH  4. Prediabetes  - Hemoglobin A1c - Insulin, random  5. Vitamin D deficiency  - VITAMIN D 25 Hydroxy   6. Pulmonary emphysema, unspecified emphysema type ([place])   7. Peripheral arterial disease ([place])   8. Screening for rectal cancer  - POC Hemoccult Bld/Stl   9. Prostate cancer screening  - PSA  10. Medication management  - Urinalysis, Routine w reflex microscopic  - CBC with Differential/Platelet - BASIC METABOLIC PANEL WITH GFR - Hepatic function panel - Magnesium   Continue prudent diet as discussed, weight control, BP monitoring, regular exercise, and medications as discussed.  Discussed med effects and SE's. Routine screening labs and tests as requested with regular follow-up as recommended. Over 40 minutes of exam, counseling, chart review and high complex critical decision making was performed

## 2015-04-13 NOTE — Patient Instructions (Signed)

## 2015-04-14 LAB — MICROALBUMIN / CREATININE URINE RATIO
Creatinine, Urine: 74 mg/dL (ref 20–370)
Microalb Creat Ratio: 12 mcg/mg creat (ref <30)
Microalb, Ur: 0.9 mg/dL

## 2015-04-14 LAB — URINALYSIS, ROUTINE W REFLEX MICROSCOPIC
Bilirubin Urine: NEGATIVE
Glucose, UA: NEGATIVE
Hgb urine dipstick: NEGATIVE
Ketones, ur: NEGATIVE
LEUKOCYTES UA: NEGATIVE
NITRITE: NEGATIVE
PROTEIN: NEGATIVE
SPECIFIC GRAVITY, URINE: 1.008 (ref 1.001–1.035)
pH: 6.5 (ref 5.0–8.0)

## 2015-04-14 LAB — HEMOGLOBIN A1C
HEMOGLOBIN A1C: 5.6 % (ref <5.7)
MEAN PLASMA GLUCOSE: 114 mg/dL (ref <117)

## 2015-04-14 LAB — VITAMIN D 25 HYDROXY (VIT D DEFICIENCY, FRACTURES): VIT D 25 HYDROXY: 67 ng/mL (ref 30–100)

## 2015-04-14 LAB — PSA: PSA: 1.61 ng/mL (ref <=4.00)

## 2015-04-14 LAB — INSULIN, RANDOM: INSULIN: 3.2 u[IU]/mL (ref 2.0–19.6)

## 2015-04-16 ENCOUNTER — Other Ambulatory Visit: Payer: Self-pay | Admitting: Internal Medicine

## 2015-04-25 ENCOUNTER — Inpatient Hospital Stay (HOSPITAL_COMMUNITY)
Admission: EM | Admit: 2015-04-25 | Discharge: 2015-05-24 | DRG: 190 | Disposition: E | Payer: PPO | Attending: Internal Medicine | Admitting: Internal Medicine

## 2015-04-25 ENCOUNTER — Encounter (HOSPITAL_COMMUNITY): Payer: Self-pay | Admitting: Nurse Practitioner

## 2015-04-25 ENCOUNTER — Emergency Department (HOSPITAL_COMMUNITY): Payer: PPO

## 2015-04-25 DIAGNOSIS — Z515 Encounter for palliative care: Secondary | ICD-10-CM | POA: Diagnosis present

## 2015-04-25 DIAGNOSIS — J9600 Acute respiratory failure, unspecified whether with hypoxia or hypercapnia: Secondary | ICD-10-CM | POA: Diagnosis not present

## 2015-04-25 DIAGNOSIS — G9340 Encephalopathy, unspecified: Secondary | ICD-10-CM | POA: Diagnosis not present

## 2015-04-25 DIAGNOSIS — E559 Vitamin D deficiency, unspecified: Secondary | ICD-10-CM | POA: Diagnosis present

## 2015-04-25 DIAGNOSIS — J441 Chronic obstructive pulmonary disease with (acute) exacerbation: Principal | ICD-10-CM | POA: Diagnosis present

## 2015-04-25 DIAGNOSIS — M1990 Unspecified osteoarthritis, unspecified site: Secondary | ICD-10-CM | POA: Diagnosis present

## 2015-04-25 DIAGNOSIS — E43 Unspecified severe protein-calorie malnutrition: Secondary | ICD-10-CM | POA: Diagnosis present

## 2015-04-25 DIAGNOSIS — J9622 Acute and chronic respiratory failure with hypercapnia: Secondary | ICD-10-CM | POA: Diagnosis present

## 2015-04-25 DIAGNOSIS — B370 Candidal stomatitis: Secondary | ICD-10-CM | POA: Diagnosis present

## 2015-04-25 DIAGNOSIS — Z681 Body mass index (BMI) 19 or less, adult: Secondary | ICD-10-CM | POA: Diagnosis not present

## 2015-04-25 DIAGNOSIS — J45909 Unspecified asthma, uncomplicated: Secondary | ICD-10-CM | POA: Diagnosis present

## 2015-04-25 DIAGNOSIS — R451 Restlessness and agitation: Secondary | ICD-10-CM | POA: Insufficient documentation

## 2015-04-25 DIAGNOSIS — E875 Hyperkalemia: Secondary | ICD-10-CM | POA: Diagnosis not present

## 2015-04-25 DIAGNOSIS — F17200 Nicotine dependence, unspecified, uncomplicated: Secondary | ICD-10-CM | POA: Diagnosis not present

## 2015-04-25 DIAGNOSIS — Z7189 Other specified counseling: Secondary | ICD-10-CM | POA: Insufficient documentation

## 2015-04-25 DIAGNOSIS — F411 Generalized anxiety disorder: Secondary | ICD-10-CM | POA: Diagnosis present

## 2015-04-25 DIAGNOSIS — J9620 Acute and chronic respiratory failure, unspecified whether with hypoxia or hypercapnia: Secondary | ICD-10-CM

## 2015-04-25 DIAGNOSIS — Z7951 Long term (current) use of inhaled steroids: Secondary | ICD-10-CM | POA: Diagnosis not present

## 2015-04-25 DIAGNOSIS — F17210 Nicotine dependence, cigarettes, uncomplicated: Secondary | ICD-10-CM | POA: Diagnosis present

## 2015-04-25 DIAGNOSIS — Z79899 Other long term (current) drug therapy: Secondary | ICD-10-CM | POA: Diagnosis not present

## 2015-04-25 DIAGNOSIS — R0600 Dyspnea, unspecified: Secondary | ICD-10-CM | POA: Insufficient documentation

## 2015-04-25 DIAGNOSIS — E785 Hyperlipidemia, unspecified: Secondary | ICD-10-CM | POA: Diagnosis present

## 2015-04-25 DIAGNOSIS — N183 Chronic kidney disease, stage 3 unspecified: Secondary | ICD-10-CM | POA: Diagnosis present

## 2015-04-25 DIAGNOSIS — Z7982 Long term (current) use of aspirin: Secondary | ICD-10-CM | POA: Diagnosis not present

## 2015-04-25 DIAGNOSIS — Z825 Family history of asthma and other chronic lower respiratory diseases: Secondary | ICD-10-CM | POA: Diagnosis not present

## 2015-04-25 DIAGNOSIS — Z9981 Dependence on supplemental oxygen: Secondary | ICD-10-CM

## 2015-04-25 DIAGNOSIS — Z823 Family history of stroke: Secondary | ICD-10-CM

## 2015-04-25 DIAGNOSIS — J9601 Acute respiratory failure with hypoxia: Secondary | ICD-10-CM | POA: Diagnosis not present

## 2015-04-25 DIAGNOSIS — F329 Major depressive disorder, single episode, unspecified: Secondary | ICD-10-CM | POA: Diagnosis present

## 2015-04-25 DIAGNOSIS — J9621 Acute and chronic respiratory failure with hypoxia: Secondary | ICD-10-CM | POA: Diagnosis present

## 2015-04-25 DIAGNOSIS — Z8249 Family history of ischemic heart disease and other diseases of the circulatory system: Secondary | ICD-10-CM

## 2015-04-25 DIAGNOSIS — Z66 Do not resuscitate: Secondary | ICD-10-CM | POA: Diagnosis present

## 2015-04-25 LAB — CBC WITH DIFFERENTIAL/PLATELET
BASOS ABS: 0 10*3/uL (ref 0.0–0.1)
BASOS PCT: 0 %
EOS ABS: 0.2 10*3/uL (ref 0.0–0.7)
Eosinophils Relative: 3 %
HEMATOCRIT: 38.7 % — AB (ref 39.0–52.0)
Hemoglobin: 12.8 g/dL — ABNORMAL LOW (ref 13.0–17.0)
Lymphocytes Relative: 14 %
Lymphs Abs: 1.1 10*3/uL (ref 0.7–4.0)
MCH: 32.2 pg (ref 26.0–34.0)
MCHC: 33.1 g/dL (ref 30.0–36.0)
MCV: 97.5 fL (ref 78.0–100.0)
MONO ABS: 0.8 10*3/uL (ref 0.1–1.0)
Monocytes Relative: 10 %
NEUTROS ABS: 6.1 10*3/uL (ref 1.7–7.7)
NEUTROS PCT: 73 %
PLATELETS: 167 10*3/uL (ref 150–400)
RBC: 3.97 MIL/uL — ABNORMAL LOW (ref 4.22–5.81)
RDW: 12.8 % (ref 11.5–15.5)
WBC: 8.3 10*3/uL (ref 4.0–10.5)

## 2015-04-25 LAB — COMPREHENSIVE METABOLIC PANEL
ALK PHOS: 76 U/L (ref 38–126)
ALT: 17 U/L (ref 17–63)
AST: 32 U/L (ref 15–41)
Albumin: 3.6 g/dL (ref 3.5–5.0)
Anion gap: 11 (ref 5–15)
BUN: 11 mg/dL (ref 6–20)
CALCIUM: 9 mg/dL (ref 8.9–10.3)
CHLORIDE: 100 mmol/L — AB (ref 101–111)
CO2: 25 mmol/L (ref 22–32)
CREATININE: 1.2 mg/dL (ref 0.61–1.24)
GFR calc Af Amer: >60 mL/min (ref >60)
Glucose, Bld: 111 mg/dL — ABNORMAL HIGH (ref 65–99)
Potassium: 4.5 mmol/L (ref 3.5–5.1)
Sodium: 136 mmol/L (ref 135–145)
Total Bilirubin: 1.3 mg/dL — ABNORMAL HIGH (ref 0.3–1.2)
Total Protein: 6.9 g/dL (ref 6.5–8.1)

## 2015-04-25 LAB — I-STAT ARTERIAL BLOOD GAS, ED
ACID-BASE EXCESS: 3 mmol/L — AB (ref 0.0–2.0)
Bicarbonate: 30.8 mEq/L — ABNORMAL HIGH (ref 20.0–24.0)
O2 SAT: 92 %
PCO2 ART: 59.9 mmHg — AB (ref 35.0–45.0)
PO2 ART: 70 mmHg — AB (ref 80.0–100.0)
Patient temperature: 97.2
TCO2: 33 mmol/L (ref 0–100)
pH, Arterial: 7.316 — ABNORMAL LOW (ref 7.350–7.450)

## 2015-04-25 LAB — I-STAT TROPONIN, ED: TROPONIN I, POC: 0 ng/mL (ref 0.00–0.08)

## 2015-04-25 MED ORDER — ONDANSETRON HCL 4 MG PO TABS: 4.0000 mg | ORAL_TABLET | Freq: Four times a day (QID) | ORAL | Status: DC | PRN

## 2015-04-25 MED ORDER — ACETAMINOPHEN 650 MG RE SUPP: 650.0000 mg | Freq: Four times a day (QID) | RECTAL | Status: DC | PRN

## 2015-04-25 MED ORDER — SODIUM CHLORIDE 0.9% FLUSH: 3.0000 mL | INTRAVENOUS | Status: DC | PRN

## 2015-04-25 MED ORDER — DOCUSATE SODIUM 100 MG PO CAPS
100.0000 mg | ORAL_CAPSULE | Freq: Two times a day (BID) | ORAL | Status: DC
Start: 2015-04-25 — End: 2015-04-29
  Administered 2015-04-26 – 2015-04-27 (×3): 100 mg via ORAL
  Filled 2015-04-25 (×6): qty 1

## 2015-04-25 MED ORDER — SODIUM CHLORIDE 0.9 % IV BOLUS (SEPSIS)
1000.0000 mL | Freq: Once | INTRAVENOUS | Status: AC
  Administered 2015-04-25: 1000 mL via INTRAVENOUS

## 2015-04-25 MED ORDER — ALBUTEROL SULFATE (2.5 MG/3ML) 0.083% IN NEBU
INHALATION_SOLUTION | RESPIRATORY_TRACT | Status: AC
  Filled 2015-04-25: qty 18

## 2015-04-25 MED ORDER — ARFORMOTEROL TARTRATE 15 MCG/2ML IN NEBU: 15.0000 ug | INHALATION_SOLUTION | Freq: Two times a day (BID) | RESPIRATORY_TRACT | Status: DC

## 2015-04-25 MED ORDER — ACETAMINOPHEN 325 MG PO TABS
650.0000 mg | ORAL_TABLET | Freq: Four times a day (QID) | ORAL | Status: DC | PRN
  Administered 2015-04-26 – 2015-04-28 (×4): 650 mg via ORAL
  Filled 2015-04-25 (×4): qty 2

## 2015-04-25 MED ORDER — IPRATROPIUM BROMIDE 0.02 % IN SOLN
RESPIRATORY_TRACT | Status: AC
Start: 2015-04-25 — End: 2015-04-25
  Administered 2015-04-25: 1 mg via RESPIRATORY_TRACT
  Filled 2015-04-25: qty 5

## 2015-04-25 MED ORDER — IPRATROPIUM-ALBUTEROL 0.5-2.5 (3) MG/3ML IN SOLN
3.0000 mL | Freq: Four times a day (QID) | RESPIRATORY_TRACT | Status: DC
Start: 2015-04-25 — End: 2015-04-28
  Administered 2015-04-25 – 2015-04-28 (×11): 3 mL via RESPIRATORY_TRACT
  Filled 2015-04-25 (×12): qty 3

## 2015-04-25 MED ORDER — ATORVASTATIN CALCIUM 40 MG PO TABS
40.0000 mg | ORAL_TABLET | Freq: Every day | ORAL | Status: DC
  Administered 2015-04-26 – 2015-04-27 (×2): 40 mg via ORAL
  Filled 2015-04-25 (×3): qty 1

## 2015-04-25 MED ORDER — SODIUM CHLORIDE 0.9% FLUSH
3.0000 mL | Freq: Two times a day (BID) | INTRAVENOUS | Status: DC
  Administered 2015-04-25 – 2015-04-29 (×7): 3 mL via INTRAVENOUS

## 2015-04-25 MED ORDER — ONDANSETRON HCL 4 MG/2ML IJ SOLN: 4.0000 mg | Freq: Four times a day (QID) | INTRAMUSCULAR | Status: DC | PRN

## 2015-04-25 MED ORDER — ENOXAPARIN SODIUM 30 MG/0.3ML ~~LOC~~ SOLN
30.0000 mg | SUBCUTANEOUS | Status: DC
  Administered 2015-04-26 – 2015-04-29 (×3): 30 mg via SUBCUTANEOUS
  Filled 2015-04-25 (×4): qty 0.3

## 2015-04-25 MED ORDER — BUDESONIDE 0.5 MG/2ML IN SUSP
0.5000 mg | Freq: Two times a day (BID) | RESPIRATORY_TRACT | Status: DC
  Administered 2015-04-25 – 2015-04-27 (×4): 0.5 mg via RESPIRATORY_TRACT
  Filled 2015-04-25 (×5): qty 2

## 2015-04-25 MED ORDER — LORAZEPAM 2 MG/ML IJ SOLN
0.5000 mg | Freq: Once | INTRAMUSCULAR | Status: AC
  Administered 2015-04-25: 0.5 mg via INTRAVENOUS
  Filled 2015-04-25: qty 1

## 2015-04-25 MED ORDER — LEVOFLOXACIN IN D5W 500 MG/100ML IV SOLN
500.0000 mg | INTRAVENOUS | Status: DC
  Administered 2015-04-25 – 2015-04-28 (×4): 500 mg via INTRAVENOUS
  Filled 2015-04-25 (×5): qty 100

## 2015-04-25 MED ORDER — IPRATROPIUM-ALBUTEROL 0.5-2.5 (3) MG/3ML IN SOLN: 3.0000 mL | Freq: Four times a day (QID) | RESPIRATORY_TRACT | Status: DC

## 2015-04-25 MED ORDER — IPRATROPIUM BROMIDE 0.02 % IN SOLN
1.0000 mg | Freq: Once | RESPIRATORY_TRACT | Status: AC
  Administered 2015-04-25: 1 mg via RESPIRATORY_TRACT

## 2015-04-25 MED ORDER — ALBUTEROL (5 MG/ML) CONTINUOUS INHALATION SOLN
15.0000 mg/h | INHALATION_SOLUTION | Freq: Once | RESPIRATORY_TRACT | Status: AC
  Administered 2015-04-25: 15 mg/h via RESPIRATORY_TRACT

## 2015-04-25 MED ORDER — SODIUM CHLORIDE 0.9 % IV SOLN: 250.0000 mL | INTRAVENOUS | Status: DC | PRN

## 2015-04-25 MED ORDER — LORAZEPAM 2 MG/ML IJ SOLN
INTRAMUSCULAR | Status: AC
  Filled 2015-04-25: qty 1

## 2015-04-25 MED ORDER — ARFORMOTEROL TARTRATE 15 MCG/2ML IN NEBU
15.0000 ug | INHALATION_SOLUTION | Freq: Two times a day (BID) | RESPIRATORY_TRACT | Status: DC
  Administered 2015-04-25 – 2015-04-29 (×8): 15 ug via RESPIRATORY_TRACT
  Filled 2015-04-25 (×10): qty 2

## 2015-04-25 MED ORDER — NYSTATIN 100000 UNIT/ML MT SUSP
5.0000 mL | Freq: Four times a day (QID) | OROMUCOSAL | Status: DC
  Administered 2015-04-26 – 2015-04-28 (×9): 500000 [IU] via ORAL
  Filled 2015-04-25 (×10): qty 5

## 2015-04-25 MED ORDER — MORPHINE SULFATE (PF) 2 MG/ML IV SOLN
0.5000 mg | INTRAVENOUS | Status: DC | PRN
  Administered 2015-04-26 – 2015-04-27 (×3): 0.5 mg via INTRAVENOUS
  Filled 2015-04-25 (×3): qty 1

## 2015-04-25 MED ORDER — METHYLPREDNISOLONE SODIUM SUCC 125 MG IJ SOLR
60.0000 mg | Freq: Three times a day (TID) | INTRAMUSCULAR | Status: DC
  Administered 2015-04-25 – 2015-04-27 (×6): 60 mg via INTRAVENOUS
  Filled 2015-04-25 (×6): qty 2

## 2015-04-25 MED ORDER — SODIUM CHLORIDE 0.9 % IV SOLN
INTRAVENOUS | Status: AC
  Administered 2015-04-25: 22:00:00 via INTRAVENOUS

## 2015-04-25 MED ORDER — ASPIRIN EC 81 MG PO TBEC
81.0000 mg | DELAYED_RELEASE_TABLET | Freq: Every day | ORAL | Status: DC
  Administered 2015-04-26 – 2015-04-27 (×2): 81 mg via ORAL
  Filled 2015-04-25 (×3): qty 1

## 2015-04-25 MED ORDER — LORAZEPAM 2 MG/ML IJ SOLN
0.5000 mg | Freq: Four times a day (QID) | INTRAMUSCULAR | Status: DC | PRN
  Administered 2015-04-25 – 2015-04-28 (×4): 0.5 mg via INTRAVENOUS
  Filled 2015-04-25 (×3): qty 1

## 2015-04-25 NOTE — ED Notes (Signed)
In to assess pt, pt is tachycardic (140's-160's), tachypneic, (30-40's), & diaphoretic, states he is not getting enough air. Called RT, told to increase O2 to 50%.  Assisted pt to use the urinal. Paged admitting doc, order for ativan received, will continue to monitor.

## 2015-04-25 NOTE — ED Provider Notes (Signed)
CSN: [id number]     Arrival date & time 05/20/2015  1447 History   First MD Initiated Contact with Patient 05/22/2015 1450     Chief Complaint  Patient presents with  . Shortness of Breath     (Consider location/radiation/quality/duration/timing/severity/associated sxs/prior Treatment) The history is provided by the patient.  [name] is a 70 y.o. male hx of COPD, asthma, HL, smoker here with SOB. Patient has been coughing for the last several days and became more short of breath. Denies any fevers or chills. Denies any productive cough. He still smokes about 4 cigarettes a day. Denies sick contacts. Came by EMS, given solumedrol and 2 g magnesium and duoneb en route. Was also placed on cpap by EMS.    Past Medical History  Diagnosis Date  . Epistaxis age 85    after blunt facial trauma. needed surgical clamps and 40 units of blood  . COPD (chronic obstructive pulmonary disease) ([place])   . Hyperlipidemia   . DDD (degenerative disc disease)   . Unspecified vitamin D deficiency   . Elevated blood pressure reading without diagnosis of hypertension   . DJD (degenerative joint disease)   . Colon polyps   . Asthma   . Osteoporosis   . Oxygen deficiency     2 LITERS, STARTED 3-4 MONTHS AGO   Past Surgical History  Procedure Laterality Date  . Bilateral knee arthroscopy    . Surgical hemostasis      in sinuses after [place]  . Prostate biopsy    . Back surgery  04-2010    disc replacement  . Colonoscopy     Family History  Problem Relation Age of Onset  . Stroke Father   . Hyperlipidemia Father   . Heart disease Father 58    deceased with cerebral aneurysm  . Heart disease Mother   . Lung disease Mother   . Asthma Mother   . Hypertension Mother   . Hyperlipidemia Mother   . Colon cancer Neg Hx   . Diabetes Sister   . Colon polyps Brother   . Leukemia Sister    Social History  Substance Use Topics  . Smoking status: Current Every Day Smoker    Types: Cigarettes  . Smokeless  tobacco: Never Used     Comment: 5 CIGS DAILY  . Alcohol Use: No    Review of Systems  Respiratory: Positive for cough and shortness of breath.   All other systems reviewed and are negative.     Allergies  Review of patient's allergies indicates no known allergies.  Home Medications   Prior to Admission medications   Medication Sig Start Date End Date Taking? Authorizing Provider  albuterol (PROVENTIL HFA;VENTOLIN HFA) 108 (90 BASE) MCG/ACT inhaler Inhale 2 puffs into the lungs every 6 (six) hours as needed for wheezing or shortness of breath. 01/26/14  Yes Deneise Lever, MD  aspirin 81 MG tablet Take 81 mg by mouth daily.   Yes Historical Provider, MD  atorvastatin (LIPITOR) 40 MG tablet Take 1 tablet (40 mg total) by mouth daily. 01/14/15  Yes Unk Pinto, MD  budesonide-formoterol [facility]) 160-4.5 MCG/ACT inhaler Inhale 2 puffs into the lungs 2 (two) times daily. rinse mouth 01/07/15 01/07/16 Yes Deneise Lever, MD  Cholecalciferol (VITAMIN D PO) Take 5,000 Units by mouth.   Yes Historical Provider, MD  DULoxetine (CYMBALTA) 60 MG capsule Take 1 capsule daily for pain 04/13/15 11/13/15 Yes Unk Pinto, MD  ipratropium-albuterol (DUONEB) 0.5-2.5 (3) MG/3ML SOLN Take  3 mLs by nebulization every 4 (four) hours as needed. Patient taking differently: Take 3 mLs by nebulization every 4 (four) hours as needed. For wheezing & shortness of breath 01/07/15  Yes Deneise Lever, MD  ibuprofen (ADVIL,MOTRIN) 200 MG tablet Take 400 mg by mouth every 6 (six) hours as needed for headache or moderate pain.     Historical Provider, MD   BP 117/70 mmHg  Pulse 99  Temp(Src) 97.2 F (36.2 C) (Oral)  Resp 29  SpO2 99% Physical Exam  Constitutional: He is oriented to person, place, and time.  Tachypneic, moderate distress   HENT:  Head: Normocephalic.  Mouth/Throat: Oropharynx is clear and moist.  Eyes: Pupils are equal, round, and reactive to light.  Neck: Normal range of motion.  Neck supple.  Cardiovascular: Normal rate, regular rhythm and normal heart sounds.   Pulmonary/Chest:  Tachypneic, some retractions, diffuse wheezing.   Abdominal: Soft. Bowel sounds are normal.  Musculoskeletal: Normal range of motion. He exhibits no edema or tenderness.  Neurological: He is alert and oriented to person, place, and time.  Skin: Skin is warm and dry.  Psychiatric: He has a normal mood and affect. His behavior is normal. Judgment and thought content normal.  Nursing note and vitals reviewed.   ED Course  Procedures (including critical care time)  CRITICAL CARE Performed by: Darl Householder, Taniya Dasher   Total critical care time: 30 minutes  Critical care time was exclusive of separately billable procedures and treating other patients.  Critical care was necessary to treat or prevent imminent or life-threatening deterioration.  Critical care was time spent personally by me on the following activities: development of treatment plan with patient and/or surrogate as well as nursing, discussions with consultants, evaluation of patient's response to treatment, examination of patient, obtaining history from patient or surrogate, ordering and performing treatments and interventions, ordering and review of laboratory studies, ordering and review of radiographic studies, pulse oximetry and re-evaluation of patient's condition.   Labs Review Labs Reviewed  CBC WITH DIFFERENTIAL/PLATELET - Abnormal; Notable for the following:    RBC 3.97 (*)    Hemoglobin 12.8 (*)    HCT 38.7 (*)    All other components within normal limits  COMPREHENSIVE METABOLIC PANEL - Abnormal; Notable for the following:    Chloride 100 (*)    Glucose, Bld 111 (*)    Total Bilirubin 1.3 (*)    All other components within normal limits  I-STAT ARTERIAL BLOOD GAS, ED - Abnormal; Notable for the following:    pH, Arterial 7.316 (*)    pCO2 arterial 59.9 (*)    pO2, Arterial 70.0 (*)    Bicarbonate 30.8 (*)     Acid-Base Excess 3.0 (*)    All other components within normal limits  BLOOD GAS, ARTERIAL  I-STAT TROPOININ, ED    Imaging Review Dg Chest Port 1 View  05/12/2015  CLINICAL DATA:  Shortness of breath, history COPD and asthma, smoker EXAM: PORTABLE CHEST 1 VIEW COMPARISON:  Portable exam 1502 hours compared to 01/10/2014 and correlated with interval CT chest of of 11/09/2014 FINDINGS: Normal heart size, mediastinal contours, and pulmonary vascularity. Emphysematous and bronchitic changes consistent with COPD. No acute infiltrate, pleural effusion or pneumothorax. Bones demineralized. IMPRESSION: COPD changes. No acute abnormalities. Electronically Signed   By: Lavonia Dana M.D.   On: 05/03/2015 15:14   I have personally reviewed and evaluated these images and lab results as part of my medical decision-making.   EKG Interpretation None  MDM   Final diagnoses:  None    [name] is a 70 y.o. male here with SOB, likely COPD exacerbation. Patient on CPAP by EMS. Will switch to bipap. Still mildly tachypneic. Will get ABG, labs, CXR. Already given solumedrol, magnesium. Will place on continuous neb.   4:18 PM On bipap with pH 7.3, CO2 60. Breathing more comfortably. Consulted ICU who feels that he likely has acute on chronic respiratory acidosis and given that he appears better, recommend stepdown. ICU to see patient on consult.      Wandra Arthurs, MD 05/12/2015 [phone]

## 2015-04-25 NOTE — ED Notes (Signed)
MD at bedside. 

## 2015-04-25 NOTE — ED Notes (Signed)
Pt reports he had to cough up mucous, removed bipap temporarily, pt had strong cough with thick mucous production. After re applying mask, pt says "its not working like it was, I feel like I am suffocating. " air flowing, no alarming, no changes to settings were made, saturations 100%, pt calm, does not appear to be in distress. Call RT who reports they will come to the bedside to evaluate patient.

## 2015-04-25 NOTE — H&P (Signed)
[facility] History and Physical  [name] [id number] DOB: [birth year] DOA: 05/06/2015  Referring physician: Dr Darl Householder.  PCP: Alesia Richards, MD   Chief Complaint: SOB   HPI: [name] is a 70 y.o. male With PMH significant for COPD chronic oxygen requirement 2 l,  current smoker, who presents with acute on chronic respiratory failure requiring BIPAP in the ED/ . Patient presents transported by  EMS.  Patient  was given solumedrol and 2 g magnesium and duoneb on route.  He was  also placed on cpap by EMS. In the ED patient received albuterol, and was place on BIPAP.  He is feeling better, breathing better. He is still on BIPAP. History obtain from wife who was at bedside. Patient started to have worsening SOB, over last 3 days. Worse today. He was feeling so weak and sick, he was not able to walk. They call EMS. He report chronic productive cough, no worse lately./ He denies fever, chest pain, no sick contact.   Evaluation in the ED; Ph 7;31, CO2 59, PO 2 70, Chest x ray: COPD changes no acute abnormalities.   Review of Systems:  Negative, except as per HPI   Past Medical History  Diagnosis Date  . Epistaxis age 35    after blunt facial trauma. needed surgical clamps and 40 units of blood  . COPD (chronic obstructive pulmonary disease) ([place])   . Hyperlipidemia   . DDD (degenerative disc disease)   . Unspecified vitamin D deficiency   . Elevated blood pressure reading without diagnosis of hypertension   . DJD (degenerative joint disease)   . Colon polyps   . Asthma   . Osteoporosis   . Oxygen deficiency     2 LITERS, STARTED 3-4 MONTHS AGO   Past Surgical History  Procedure Laterality Date  . Bilateral knee arthroscopy    . Surgical hemostasis      in sinuses after [place]  . Prostate biopsy    . Back surgery  04-2010    disc replacement  . Colonoscopy     Social History:  reports that he has been smoking Cigarettes.  He has never used smokeless tobacco. He  reports that he does not drink alcohol or use illicit drugs.  No Known Allergies  Family History  Problem Relation Age of Onset  . Stroke Father   . Hyperlipidemia Father   . Heart disease Father 55    deceased with cerebral aneurysm  . Heart disease Mother   . Lung disease Mother   . Asthma Mother   . Hypertension Mother   . Hyperlipidemia Mother   . Colon cancer Neg Hx   . Diabetes Sister   . Colon polyps Brother   . Leukemia Sister     Prior to Admission medications   Medication Sig Start Date End Date Taking? Authorizing Provider  albuterol (PROVENTIL HFA;VENTOLIN HFA) 108 (90 BASE) MCG/ACT inhaler Inhale 2 puffs into the lungs every 6 (six) hours as needed for wheezing or shortness of breath. 01/26/14  Yes Deneise Lever, MD  aspirin 81 MG tablet Take 81 mg by mouth daily.   Yes Historical Provider, MD  atorvastatin (LIPITOR) 40 MG tablet Take 1 tablet (40 mg total) by mouth daily. 01/14/15  Yes Unk Pinto, MD  budesonide-formoterol [facility]) 160-4.5 MCG/ACT inhaler Inhale 2 puffs into the lungs 2 (two) times daily. rinse mouth 01/07/15 01/07/16 Yes Deneise Lever, MD  Cholecalciferol (VITAMIN D PO) Take 5,000 Units by mouth.   Yes  Historical Provider, MD  DULoxetine (CYMBALTA) 60 MG capsule Take 1 capsule daily for pain 04/13/15 11/13/15 Yes Unk Pinto, MD  ipratropium-albuterol (DUONEB) 0.5-2.5 (3) MG/3ML SOLN Take 3 mLs by nebulization every 4 (four) hours as needed. Patient taking differently: Take 3 mLs by nebulization every 4 (four) hours as needed. For wheezing & shortness of breath 01/07/15  Yes Deneise Lever, MD  ibuprofen (ADVIL,MOTRIN) 200 MG tablet Take 400 mg by mouth every 6 (six) hours as needed for headache or moderate pain.     Historical Provider, MD   Physical Exam: Filed Vitals:   05/03/2015 1455 05/07/2015 1510 05/10/2015 1530 05/04/2015 1545  BP:   115/97 117/70  Pulse:   106 99  Temp:      TempSrc:      Resp:   29 29  SpO2: 98% 99% 97% 99%     Wt Readings from Last 3 Encounters:  04/13/15 61.054 kg (134 lb 9.6 oz)  01/07/15 64.229 kg (141 lb 9.6 oz)  12/03/14 63.504 kg (140 lb)    General:  Appears calm and comfortable on BIPAP Eyes: PERRL, normal lids, irises & conjunctiva ENT: grossly normal hearing, lips & tongue Neck: no LAD, masses or thyromegaly Cardiovascular: RRR, no m/r/g. No LE edema. Respiratory: Normal respiratory effort. Bilateral air movement, on BIPAP, no wheezing.  Abdomen: soft, ntnd Skin: no rash or induration seen on limited exam Musculoskeletal: grossly normal tone BUE/BLE Psychiatric: grossly normal mood and affect, speech fluent and appropriate Neurologic: grossly non-focal.          Labs on Admission:  Basic Metabolic Panel:  Recent Labs Lab 04/29/2015 1519  NA 136  K 4.5  CL 100*  CO2 25  GLUCOSE 111*  BUN 11  CREATININE 1.20  CALCIUM 9.0   Liver Function Tests:  Recent Labs Lab 05/12/2015 1519  AST 32  ALT 17  ALKPHOS 76  BILITOT 1.3*  PROT 6.9  ALBUMIN 3.6   No results for input(s): LIPASE, AMYLASE in the last 168 hours. No results for input(s): AMMONIA in the last 168 hours. CBC:  Recent Labs Lab 04/24/2015 1519  WBC 8.3  NEUTROABS 6.1  HGB 12.8*  HCT 38.7*  MCV 97.5  PLT 167   Cardiac Enzymes: No results for input(s): CKTOTAL, CKMB, CKMBINDEX, TROPONINI in the last 168 hours.  BNP (last 3 results) No results for input(s): BNP in the last 8760 hours.  ProBNP (last 3 results) No results for input(s): PROBNP in the last 8760 hours.  CBG: No results for input(s): GLUCAP in the last 168 hours.  Radiological Exams on Admission: Dg Chest Port 1 View  05/06/2015  CLINICAL DATA:  Shortness of breath, history COPD and asthma, smoker EXAM: PORTABLE CHEST 1 VIEW COMPARISON:  Portable exam 1502 hours compared to 01/10/2014 and correlated with interval CT chest of of 11/09/2014 FINDINGS: Normal heart size, mediastinal contours, and pulmonary vascularity. Emphysematous  and bronchitic changes consistent with COPD. No acute infiltrate, pleural effusion or pneumothorax. Bones demineralized. IMPRESSION: COPD changes. No acute abnormalities. Electronically Signed   By: Lavonia Dana M.D.   On: 04/29/2015 15:14    EKG: Independently reviewed.   Assessment/Plan Active Problems:   TOBACCO USER   CKD Stage III (GFR 53 ml/min)   COPD exacerbation (HCC)   Acute on chronic respiratory failure (HCC)   Acute Hypoxic, hypercapnic Respiratory Failure. COPD exacerbation.  In setting COPD exacerbation , patient current smoker.  Admit to step down unit. Improved on BIPAP.  IV solumedrol,  IV antibiotics, nebulizer treatments.  Symbicort when off BIPAP/  Chest xray COPD changes, no new abnormalities.  CCM will see patient in consultation   HLD; continue with statins.   Current smoker; smoking cessation counseling provided.   Oral Trush: nystatin swiss swallow stating am, if off bipap/   Code Status: full code, does not want to be on vent for long period of time/  DVT Prophylaxis: Lovenox Family Communication:  Disposition Plan: expect 3 to 4 days inpatient.   Time spent: 75 minutes.   Niel Hummer A [facility] Pager [phone]

## 2015-04-25 NOTE — ED Notes (Signed)
PCCM consult at bedside.

## 2015-04-25 NOTE — ED Notes (Signed)
Portable xray at bedside.

## 2015-04-25 NOTE — Progress Notes (Addendum)
RT called by RN to place patient back on Bipap after a resting period per MD order on 2 Lpm nasal cannula.

## 2015-04-25 NOTE — ED Notes (Signed)
Per EMS pt from home called out for shortness of breath. Patient had increased ShOB over the past few days with chronic cough. Today patient was trying to get dressed and became shob and unable to return to baseline with rest and inhalers. Upon EMS arrival patient had increased work of breathing, diaphoretic with inspiratory and expiratory wheezing throughout, and diminished bases. Pt has hx of COPD.  Patient given Duoneb 5mg /0.5mg  neb treatment, 125mg  Solumederol IV, 2g of magnesium with minimal decreased in respiratory effort. Patient placed on CPAP.

## 2015-04-25 NOTE — Consult Note (Signed)
Name: [name] MRN: [id number] DOB: 21-Sep-1945    ADMISSION DATE:  05/03/2015 CONSULTATION DATE:  4/2  REFERRING MD :  Regalado   CHIEF COMPLAINT:  Acute on chronic respiratory failure   BRIEF PATIENT DESCRIPTION:  This is a 70 year old male w/ chronic respiratory failure in the setting of severe COPD (FEV1 is 18% predicted). Still smokes. Followed by Dr Annamaria Boots. Presents to the ED w/ 3-5d h/o worsening nasal congestion, cough that was productive of clear mucous and worsening shortness of breath. He has been taking rescue SABA about every 2-3 hrs. This eventually did not help any further and he had resting shortness of breath so he presented to the ED (at baseline he has very limited pulmonary fxn and activity tol). In ER he was found to be in sig respiratory distress His PCO2 was 60 so he was placed on BIPAP. He was pending SDU admit. PCCM asked to assist w. RX  SIGNIFICANT EVENTS  PCCM consulted on admit. Made DNR     PAST MEDICAL HISTORY :   has a past medical history of Epistaxis (age 5); COPD (chronic obstructive pulmonary disease) ([place]); Hyperlipidemia; DDD (degenerative disc disease); Unspecified vitamin D deficiency; Elevated blood pressure reading without diagnosis of hypertension; DJD (degenerative joint disease); Colon polyps; Asthma; Osteoporosis; and Oxygen deficiency.  has past surgical history that includes Bilateral knee arthroscopy; surgical hemostasis; Prostate biopsy; Back surgery (04-2010); and Colonoscopy. Prior to Admission medications   Medication Sig Start Date End Date Taking? Authorizing Provider  albuterol (PROVENTIL HFA;VENTOLIN HFA) 108 (90 BASE) MCG/ACT inhaler Inhale 2 puffs into the lungs every 6 (six) hours as needed for wheezing or shortness of breath. 01/26/14  Yes Deneise Lever, MD  aspirin 81 MG tablet Take 81 mg by mouth daily.   Yes Historical Provider, MD  atorvastatin (LIPITOR) 40 MG tablet Take 1 tablet (40 mg total) by mouth daily. 01/14/15  Yes  Unk Pinto, MD  budesonide-formoterol [facility]) 160-4.5 MCG/ACT inhaler Inhale 2 puffs into the lungs 2 (two) times daily. rinse mouth 01/07/15 01/07/16 Yes Deneise Lever, MD  Cholecalciferol (VITAMIN D PO) Take 5,000 Units by mouth.   Yes Historical Provider, MD  DULoxetine (CYMBALTA) 60 MG capsule Take 1 capsule daily for pain 04/13/15 11/13/15 Yes Unk Pinto, MD  ipratropium-albuterol (DUONEB) 0.5-2.5 (3) MG/3ML SOLN Take 3 mLs by nebulization every 4 (four) hours as needed. Patient taking differently: Take 3 mLs by nebulization every 4 (four) hours as needed. For wheezing & shortness of breath 01/07/15  Yes Deneise Lever, MD  ibuprofen (ADVIL,MOTRIN) 200 MG tablet Take 400 mg by mouth every 6 (six) hours as needed for headache or moderate pain.     Historical Provider, MD   No Known Allergies  FAMILY HISTORY:  family history includes Asthma in his mother; Colon polyps in his brother; Diabetes in his sister; Heart disease in his mother; Heart disease (age of onset: 39) in his father; Hyperlipidemia in his father and mother; Hypertension in his mother; Leukemia in his sister; Lung disease in his mother; Stroke in his father. There is no history of Colon cancer. SOCIAL HISTORY:  reports that he has been smoking Cigarettes.  He has never used smokeless tobacco. He reports that he does not drink alcohol or use illicit drugs.  REVIEW OF SYSTEMS:   Constitutional: Negative for fever, chills, weight loss, malaise/fatigue and diaphoresis.  HENT: Negative for hearing loss, ear pain, nosebleeds, congestion, sore throat, neck pain, tinnitus and ear discharge.  Eyes: Negative for blurred vision, double vision, photophobia, pain, discharge and redness.  Respiratory: Negative for cough, hemoptysis, sputum production was clear , shortness of breath @ rest , wheezing and stridor.   Cardiovascular: Negative for chest pain, palpitations, orthopnea, claudication, leg swelling and PND.    Gastrointestinal: Negative for heartburn, nausea, vomiting, abdominal pain, diarrhea, constipation, blood in stool and melena.  Genitourinary: Negative for dysuria, urgency, frequency, hematuria and flank pain.  Musculoskeletal: Negative for myalgias, back pain, joint pain and falls.  Skin: Negative for itching and rash.  Neurological: Negative for dizziness, tingling, tremors, sensory change, speech change, focal weakness, seizures, loss of consciousness, weakness and headaches.  Endo/Heme/Allergies: Negative for environmental allergies and polydipsia. Does not bruise/bleed easily.  SUBJECTIVE:  Feels better  VITAL SIGNS: Temp:  [97.2 F (36.2 C)] 97.2 F (36.2 C) (04/02 1451) Pulse Rate:  [87-112] 91 (04/02 1737) Resp:  [19-29] 21 (04/02 1737) BP: (112-117)/(58-97) 115/67 mmHg (04/02 1737) SpO2:  [95 %-100 %] 99 % (04/02 1737) FiO2 (%):  [40 %] 40 % (04/02 1737)  PHYSICAL EXAMINATION: General:  Frail 70 year old male, resting in bed. No acute distress on NIPPV Neuro:  Awake, alert, no focal def  HEENT:  NCAT, MM are moist. No JVD Cardiovascular:  Rrr, no MRG  Lungs:  Decreased t/o, some accessory muscle use off BIPAP  Abdomen:  Soft, not tender + bowel sounds  Musculoskeletal:  Good strength and bulk  Skin:  Warm and intact  ABG    Component Value Date/Time   PHART 7.316* 05/14/2015 1550   PCO2ART 59.9* 04/29/2015 1550   PO2ART 70.0* 05/22/2015 1550   HCO3 30.8* 05/19/2015 1550   TCO2 33 05/03/2015 1550   O2SAT 92.0 04/29/2015 1550      Recent Labs Lab 05/17/2015 1519  NA 136  K 4.5  CL 100*  CO2 25  BUN 11  CREATININE 1.20  GLUCOSE 111*    Recent Labs Lab 04/24/2015 1519  HGB 12.8*  HCT 38.7*  WBC 8.3  PLT 167   Dg Chest Port 1 View  05/15/2015  CLINICAL DATA:  Shortness of breath, history COPD and asthma, smoker EXAM: PORTABLE CHEST 1 VIEW COMPARISON:  Portable exam 1502 hours compared to 01/10/2014 and correlated with interval CT chest of of 11/09/2014  FINDINGS: Normal heart size, mediastinal contours, and pulmonary vascularity. Emphysematous and bronchitic changes consistent with COPD. No acute infiltrate, pleural effusion or pneumothorax. Bones demineralized. IMPRESSION: COPD changes. No acute abnormalities. Electronically Signed   By: Lavonia Dana M.D.   On: 05/05/2015 15:14    ASSESSMENT / PLAN:  Acute on chronic hypercarbicrespiratory failure in the setting of AECOPD  -his FEV1 is 18%  -He would be a terrible candidate for life support. We discussed Code status. He does not want vent or CPR  Plan Cycle BIPAP w/ mandatory rests off bipap Scheduled Nebs: brovana/budesonide and PRN SABA Systemic steroids 5d levaquin reasonable  Made DNR   Erick Colace ACNP-BC [place] Pager # [phone] OR # [phone] if no answer   05/22/2015, 5:58 PM  Attending Note:  70 year old male with very severe COPD with FEV1 of 18% of predicted who is in a very poor functional status presenting with a COPD exacerbation requiring BiPAP.  On exam, very distant BS and wheezing.  I reviewed CXR myself, severe hyperinflation.  Discussed with PCCM-NP.  COPD exacerbation:  - Steroids.  - Abx.  - Inhalers as ordered.  Acute respiratory failure with hypercarbia:  -  BiPAP as ordered.  - Titrate O2 for sat of 88-92%.  Severe deconditioning:  - Due to respiratory disease.  - Nutrition consult.  - PT/OT when off BiPAP.  End of life discussion:  - Lengthy discussion with patient and wife.  After discussion, decision was made to make patient a full DNR.  If worsens then comfort care.  PCCM will follow with you.  Patient seen and examined, agree with above note.  I dictated the care and orders written for this patient under my direction.  Rush Farmer, MD [phone]

## 2015-04-26 DIAGNOSIS — J9600 Acute respiratory failure, unspecified whether with hypoxia or hypercapnia: Secondary | ICD-10-CM

## 2015-04-26 LAB — BASIC METABOLIC PANEL
Anion gap: 7 (ref 5–15)
BUN: 18 mg/dL (ref 6–20)
CALCIUM: 9 mg/dL (ref 8.9–10.3)
CHLORIDE: 103 mmol/L (ref 101–111)
CO2: 28 mmol/L (ref 22–32)
CREATININE: 1.23 mg/dL (ref 0.61–1.24)
GFR calc non Af Amer: 59 mL/min — ABNORMAL LOW (ref >60)
Glucose, Bld: 130 mg/dL — ABNORMAL HIGH (ref 65–99)
Potassium: 5 mmol/L (ref 3.5–5.1)
Sodium: 138 mmol/L (ref 135–145)

## 2015-04-26 LAB — CBC
HCT: 35.3 % — ABNORMAL LOW (ref 39.0–52.0)
Hemoglobin: 11.3 g/dL — ABNORMAL LOW (ref 13.0–17.0)
MCH: 31.3 pg (ref 26.0–34.0)
MCHC: 32 g/dL (ref 30.0–36.0)
MCV: 97.8 fL (ref 78.0–100.0)
PLATELETS: 152 10*3/uL (ref 150–400)
RBC: 3.61 MIL/uL — AB (ref 4.22–5.81)
RDW: 12.8 % (ref 11.5–15.5)
WBC: 4.9 10*3/uL (ref 4.0–10.5)

## 2015-04-26 LAB — MRSA PCR SCREENING: MRSA BY PCR: NEGATIVE

## 2015-04-26 MED ORDER — IPRATROPIUM-ALBUTEROL 0.5-2.5 (3) MG/3ML IN SOLN: 3.0000 mL | RESPIRATORY_TRACT | Status: DC | PRN

## 2015-04-26 NOTE — Consult Note (Signed)
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 Still smokes. Followed by Dr Annamaria Boots. Presents to the ED w/ 3-5d h/o worsening nasal congestion, cough that was productive of clear mucous and worsening shortness of breath. He has been taking rescue SABA about every 2-3 hrs. This eventually did not help any further and he had resting shortness of breath so he presented to the ED (at baseline he has very limited pulmonary fxn and activity tol). In ER he was found to be in sig respiratory distress His PCO2 was 60 so he was placed on BIPAP. He was pending SDU admit. PCCM asked to assist w. RX   SIGNIFICANT EVENTS  PCCM consulted on admit. Made DNR  SUBJECTIVE: Patient was found resting comfortably. Wife was at bedside.  Used BiPAP at night.    VITAL SIGNS: Temp:  [97.1 F (36.2 C)-98.1 F (36.7 C)] 98.1 F (36.7 C) (04/03 0812) Pulse Rate:  [87-146] 106 (04/03 0300) Resp:  [16-54] 20 (04/03 0300) BP: (105-175)/(58-97) 108/81 mmHg (04/03 0300) SpO2:  [89 %-100 %] 98 % (04/03 0847) FiO2 (%):  [40 %-50 %] 50 % (04/03 0300) Weight:  [130 lb 4.7 oz (59.1 kg)] 130 lb 4.7 oz (59.1 kg) (04/02 2136)  PHYSICAL EXAMINATION: General:  Elderly, sickly appearing white male  Neuro:  Awake, alert,follows command HEENT:  Atraumatic, normocephalic, no discharge Cardiovascular: S1S2 ,RRR, No MRGnoted Lungs:  Diminished, faint expiratory wheezes  Abdomen:  Soft, not tender + bowel sounds  Musculoskeletal:  Good tone , no inflammation, deformity Skin:  Warm and intact, no bruises, petechiae, rashes noted ABG    Component Value Date/Time   PHART 7.316* 04/24/2015 1550   PCO2ART 59.9* 05/03/2015 1550   PO2ART 70.0*  05/14/2015 1550   HCO3 30.8* 04/24/2015 1550   TCO2 33 05/04/2015 1550   O2SAT 92.0 04/26/2015 1550      Recent Labs Lab 04/24/2015 1519 04/26/15 0514  NA 136 138  K 4.5 5.0  CL 100* 103  CO2 25 28  BUN 11 18  CREATININE 1.20 1.23  GLUCOSE 111* 130*    Recent Labs Lab 05/12/2015 1519 04/26/15 0514  HGB 12.8* 11.3*  HCT 38.7* 35.3*  WBC 8.3 4.9  PLT 167 152   Dg Chest Port 1 View  05/01/2015  CLINICAL DATA:  Shortness of breath, history COPD and asthma, smoker EXAM: PORTABLE CHEST 1 VIEW COMPARISON:  Portable exam 1502 hours compared to 01/10/2014 and correlated with interval CT chest of of 11/09/2014 FINDINGS: Normal heart size, mediastinal contours, and pulmonary vascularity. Emphysematous and bronchitic changes consistent with COPD. No acute infiltrate, pleural effusion or pneumothorax. Bones demineralized. IMPRESSION: COPD changes. No acute abnormalities. Electronically Signed   By: Lavonia Dana M.D.   On: 04/29/2015 15:14    ASSESSMENT / PLAN:  Acute on chronic hypercarbicrespiratory failure in the setting of AECOPD  -his FEV1 is 18% Severe Deconditioning   P Continue BiPAP as needed with periods of rest in between Keep sats >88% Continue Steroids Continue Levaquin day #2 Continue Duoneb/Pulmicort/Albuterol/ Encourage use of flutter valve CXR in am PT consult     Kaneohe Varughese,AG-ACNP Pulmonary & Critical Care  Attending Note:  I have examined patient,  reviewed labs, studies and notes. I have discussed the case with B Varughese, and I agree with the data and plans as amended above. He is now off BiPAp and is tolerating. I mentioned BiPAP qhs to him as a tool for home - not sure that he would want to do this. He could discuss further with Dr Annamaria Boots as an outpatient. He would like a more portable O2 system for home - could arrange for this at discharge through his DME company. PCCM will sign off, please call if we can assist in any way.   Baltazar Apo, MD,  PhD 04/26/2015, 12:17 PM [facility] [phone] or if no answer [phone]

## 2015-04-26 NOTE — Progress Notes (Signed)
   04/26/15 0321  Oxygen Therapy  SpO2 94 %  O2 Device Nasal Cannula  O2 Flow Rate (L/min) 4 L/min  Pulse Oximetry Type Continuous  Pt woke up and wanted BiPAP off he has been on approximately 5 hours pt placed on 4 liters of o2 via [place].

## 2015-04-26 NOTE — Progress Notes (Signed)
Patient ID: Dayton Ropp, male   DOB: 1945-08-20, 70 y.o.   MRN: [id number]  [facility] PROGRESS NOTE  Toronto Trabert [id number] DOB: [birth year] DOA: 2015/05/19 PCP: Alesia Richards, MD   Brief narrative:    70 y.o. male with COPD chronic oxygen requirement 2 L, current smoker, who presented with acute on chronic respiratory failure requiring BIPAP in the ED.   Evaluation in the ED; Ph 7.31, CO2 59, PO 2 70, Chest x ray: COPD changes no acute abnormalities.   Assessment/Plan:    Acute on chronic hypercarbicrespiratory failure in the setting of AECOPD - with known FEV1 is 18% and Severe Deconditioning - has been off BiPAP, stable this AM but still with some wheezing - continue BD's scheduled and as needed, solumedrol - keep on oxygen  - appreciate PCCM input  - Levaquin day #2  DVT prophylaxis - Lovenox SQ  Code Status: Full.  Family Communication:  plan of care discussed with the patient and wife at bedside  Disposition Plan: Home by 4/5   IV access:  Peripheral IV  Procedures and diagnostic studies:    Dg Chest Port 1 View 2015/05/19  COPD changes. No acute abnormalities.   Medical Consultants:  PCCM - signed off 4/3  Other Consultants:  PT  IAnti-Infectives:   Levaquin 4/3 -->  Faye Ramsay, MD  [place] Pager [phone]  If 7PM-7AM, please contact night-coverage [email or web address] Password [email or web address] 04/26/2015, 5:39 PM   LOS: 1 day   HPI/Subjective: No events overnight.   Objective: Filed Vitals:   04/26/15 0812 04/26/15 0847 04/26/15 1158 04/26/15 1515  BP: 131/81  119/69 119/75  Pulse: 94  94 101  Temp: 98.1 F (36.7 C)  98.1 F (36.7 C) 98.4 F (36.9 C)  TempSrc: Oral  Oral Oral  Resp: 20  20 25   Height:      Weight:      SpO2: 99% 98% 98% 100%    Intake/Output Summary (Last 24 hours) at 04/26/15 1739 Last data filed at 04/26/15 1600  Gross per 24 hour  Intake   2715 ml  Output    650 ml  Net   May 19, 2063 ml    Exam:   General:  Pt  is alert, not in acute distress  Cardiovascular: Regular rate and rhythm, no rubs, no gallops  Respiratory: diminished breath sounds at bases with exp wheezing   Abdomen: Soft, non tender, non distended, bowel sounds present, no guarding  Data Reviewed: Basic Metabolic Panel:  Recent Labs Lab 2015/05/19 1519 04/26/15 0514  NA 136 138  K 4.5 5.0  CL 100* 103  CO2 25 28  GLUCOSE 111* 130*  BUN 11 18  CREATININE 1.20 1.23  CALCIUM 9.0 9.0   Liver Function Tests:  Recent Labs Lab May 19, 2015 1519  AST 32  ALT 17  ALKPHOS 76  BILITOT 1.3*  PROT 6.9  ALBUMIN 3.6   No results for input(s): LIPASE, AMYLASE in the last 168 hours. No results for input(s): AMMONIA in the last 168 hours. CBC:  Recent Labs Lab May 19, 2015 1519 04/26/15 0514  WBC 8.3 4.9  NEUTROABS 6.1  --   HGB 12.8* 11.3*  HCT 38.7* 35.3*  MCV 97.5 97.8  PLT 167 152     Recent Results (from the past 240 hour(s))  MRSA PCR Screening     Status: None   Collection Time: 04/26/15  3:48 AM  Result Value Ref Range Status   MRSA by PCR NEGATIVE NEGATIVE Final    Comment:  The GeneXpert MRSA Assay (FDA approved for NASAL specimens only), is one component of a comprehensive MRSA colonization surveillance program. It is not intended to diagnose MRSA infection nor to guide or monitor treatment for MRSA infections.      Scheduled Meds: . arformoterol  15 mcg Nebulization BID  . aspirin EC  81 mg Oral Daily  . atorvastatin  40 mg Oral Daily  . budesonide (PULMICORT) nebulizer solution  0.5 mg Nebulization BID  . docusate sodium  100 mg Oral BID  . enoxaparin (LOVENOX) injection  30 mg Subcutaneous Q24H  . ipratropium-albuterol  3 mL Nebulization Q6H  . levofloxacin (LEVAQUIN) IV  500 mg Intravenous Q24H  . methylPREDNISolone (SOLU-MEDROL) injection  60 mg Intravenous 3 times per day  . nystatin  5 mL Oral QID  . sodium chloride flush  3 mL Intravenous Q12H   Continuous Infusions:

## 2015-04-27 ENCOUNTER — Inpatient Hospital Stay: Admission: RE | Admit: 2015-04-27 | Payer: Commercial Managed Care - HMO | Source: Ambulatory Visit

## 2015-04-27 DIAGNOSIS — J9601 Acute respiratory failure with hypoxia: Secondary | ICD-10-CM

## 2015-04-27 LAB — BASIC METABOLIC PANEL
ANION GAP: 9 (ref 5–15)
BUN: 20 mg/dL (ref 6–20)
CO2: 30 mmol/L (ref 22–32)
Calcium: 9.1 mg/dL (ref 8.9–10.3)
Chloride: 101 mmol/L (ref 101–111)
Creatinine, Ser: 1.18 mg/dL (ref 0.61–1.24)
GLUCOSE: 126 mg/dL — AB (ref 65–99)
POTASSIUM: 5.1 mmol/L (ref 3.5–5.1)
SODIUM: 140 mmol/L (ref 135–145)

## 2015-04-27 LAB — CBC
HCT: 36 % — ABNORMAL LOW (ref 39.0–52.0)
Hemoglobin: 11.3 g/dL — ABNORMAL LOW (ref 13.0–17.0)
MCH: 30.9 pg (ref 26.0–34.0)
MCHC: 31.4 g/dL (ref 30.0–36.0)
MCV: 98.4 fL (ref 78.0–100.0)
PLATELETS: 155 10*3/uL (ref 150–400)
RBC: 3.66 MIL/uL — AB (ref 4.22–5.81)
RDW: 13 % (ref 11.5–15.5)
WBC: 12 10*3/uL — AB (ref 4.0–10.5)

## 2015-04-27 MED ORDER — NON FORMULARY: 160.0000 ug/d | Freq: Two times a day (BID) | Status: DC

## 2015-04-27 MED ORDER — DULOXETINE HCL 60 MG PO CPEP
60.0000 mg | ORAL_CAPSULE | Freq: Every day | ORAL | Status: DC
  Administered 2015-04-27: 60 mg via ORAL
  Filled 2015-04-27 (×2): qty 1

## 2015-04-27 MED ORDER — NICOTINE 21 MG/24HR TD PT24
21.0000 mg | MEDICATED_PATCH | Freq: Every day | TRANSDERMAL | Status: DC
  Administered 2015-04-27 – 2015-04-29 (×3): 21 mg via TRANSDERMAL
  Filled 2015-04-27 (×3): qty 1

## 2015-04-27 MED ORDER — MORPHINE SULFATE (PF) 2 MG/ML IV SOLN
1.0000 mg | INTRAVENOUS | Status: DC | PRN
  Administered 2015-04-28 – 2015-04-29 (×5): 2 mg via INTRAVENOUS
  Filled 2015-04-27 (×5): qty 1

## 2015-04-27 MED ORDER — METHYLPREDNISOLONE SODIUM SUCC 125 MG IJ SOLR
60.0000 mg | Freq: Two times a day (BID) | INTRAMUSCULAR | Status: DC
  Administered 2015-04-28: 60 mg via INTRAVENOUS
  Filled 2015-04-27: qty 2

## 2015-04-27 MED ORDER — BUDESONIDE-FORMOTEROL FUMARATE 160-4.5 MCG/ACT IN AERO
2.0000 | INHALATION_SPRAY | Freq: Two times a day (BID) | RESPIRATORY_TRACT | Status: DC
  Administered 2015-04-27 – 2015-04-29 (×4): 2 via RESPIRATORY_TRACT
  Filled 2015-04-27: qty 6

## 2015-04-27 NOTE — Progress Notes (Signed)
Went to pt room HR 140's oxygen saturations 78 % pt panicking had been using bathroom could not get oxygen level up pt placed on BiPap @ 50% given prn  Ativan pt stayed on BiPAP till 2210 now back on 4 liters HR 108 resting comfortably will continue to monitor.

## 2015-04-27 NOTE — Progress Notes (Signed)
Patient ID: [name], male   DOB: 07-18-1945, 70 y.o.   MRN: [id number]  [facility] PROGRESS NOTE  [name] [id number] DOB: [birth year] DOA: 05-23-2015 PCP: Alesia Richards, MD   Brief narrative:    70 y.o. male with COPD chronic oxygen requirement 2 L, current smoker, who presented with acute on chronic respiratory failure requiring BIPAP in the ED.   Evaluation in the ED; Ph 7.31, CO2 59, PO 2 70, Chest x ray: COPD changes no acute abnormalities.   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  Family Communication:  plan of care discussed with the patient and wife at bedside  Disposition Plan: Home by 4/6  IV access:  Peripheral IV  Procedures and diagnostic studies:    Dg Chest Port 1 View 05-23-2015  COPD changes. No acute abnormalities.   Medical Consultants:  PCCM - signed off 4/3  Other Consultants:  PT  IAnti-Infectives:   Levaquin 4/3 -->  Faye Ramsay, MD  [place] Pager [phone]  If 7PM-7AM, please contact night-coverage [email or web address] Password [email or web address] 04/27/2015, 5:34 PM   LOS: 2 days   HPI/Subjective: No events overnight.   Objective: Filed Vitals:   04/27/15 1155 04/27/15 1410 04/27/15 1455 04/27/15 1500  BP: 128/67   166/94  Pulse: 113   80  Temp:   97.5 F (36.4 C)   TempSrc:   Oral   Resp: 21   23  Height:       Weight:      SpO2: 96% 100%  100%    Intake/Output Summary (Last 24 hours) at 04/27/15 1734 Last data filed at 04/27/15 1458  Gross per 24 hour  Intake    580 ml  Output   1700 ml  Net  -1120 ml    Exam:   General:  Pt is alert, not in acute distress  Cardiovascular: Regular rate and rhythm, no rubs, no gallops  Respiratory: diminished breath sounds at bases with exp wheezing   Abdomen: Soft, non tender, non distended, bowel sounds present, no guarding  Data Reviewed: Basic Metabolic Panel:  Recent Labs Lab 23-May-2015 1519 04/26/15 0514 04/27/15 0342  NA 136 138 140  K 4.5 5.0 5.1  CL 100* 103 101  CO2 25 28 30   GLUCOSE 111* 130* 126*  BUN 11 18 20   CREATININE 1.20 1.23 1.18  CALCIUM 9.0 9.0 9.1   Liver Function Tests:  Recent Labs Lab 2015-05-23 1519  AST 32  ALT 17  ALKPHOS 76  BILITOT 1.3*  PROT 6.9  ALBUMIN 3.6   No results for input(s): LIPASE, AMYLASE in the last 168 hours. No results for input(s): AMMONIA in the last 168 hours. CBC:  Recent Labs Lab 05/23/2015 1519 04/26/15 0514 04/27/15 0342  WBC 8.3 4.9 12.0*  NEUTROABS 6.1  --   --   HGB 12.8* 11.3*  11.3*  HCT 38.7* 35.3* 36.0*  MCV 97.5 97.8 98.4  PLT 167 152 155     Recent Results (from the past 240 hour(s))  MRSA PCR Screening     Status: None   Collection Time: 04/26/15  3:48 AM  Result Value Ref Range Status   MRSA by PCR NEGATIVE NEGATIVE Final    Comment:        The GeneXpert MRSA Assay (FDA approved for NASAL specimens only), is one component of a comprehensive MRSA colonization surveillance program. It is not intended to diagnose MRSA infection nor to guide or monitor treatment for MRSA infections.      Scheduled Meds: . arformoterol  15 mcg Nebulization BID  . aspirin EC  81 mg Oral Daily  . atorvastatin  40 mg Oral Daily  . budesonide-formoterol  2 puff Inhalation BID  . docusate sodium  100 mg Oral BID  . DULoxetine  60 mg Oral Daily  . enoxaparin  (LOVENOX) injection  30 mg Subcutaneous Q24H  . ipratropium-albuterol  3 mL Nebulization Q6H  . levofloxacin (LEVAQUIN) IV  500 mg Intravenous Q24H  . methylPREDNISolone (SOLU-MEDROL) injection  60 mg Intravenous 3 times per day  . nicotine  21 mg Transdermal Daily  . nystatin  5 mL Oral QID  . sodium chloride flush  3 mL Intravenous Q12H   Continuous Infusions:

## 2015-04-28 ENCOUNTER — Other Ambulatory Visit: Payer: Self-pay | Admitting: Internal Medicine

## 2015-04-28 DIAGNOSIS — R0600 Dyspnea, unspecified: Secondary | ICD-10-CM | POA: Insufficient documentation

## 2015-04-28 DIAGNOSIS — Z7189 Other specified counseling: Secondary | ICD-10-CM | POA: Insufficient documentation

## 2015-04-28 DIAGNOSIS — F411 Generalized anxiety disorder: Secondary | ICD-10-CM | POA: Insufficient documentation

## 2015-04-28 DIAGNOSIS — Z515 Encounter for palliative care: Secondary | ICD-10-CM | POA: Insufficient documentation

## 2015-04-28 LAB — CBC
HCT: 38.6 % — ABNORMAL LOW (ref 39.0–52.0)
HEMOGLOBIN: 12.1 g/dL — AB (ref 13.0–17.0)
MCH: 31 pg (ref 26.0–34.0)
MCHC: 31.3 g/dL (ref 30.0–36.0)
MCV: 99 fL (ref 78.0–100.0)
Platelets: 143 10*3/uL — ABNORMAL LOW (ref 150–400)
RBC: 3.9 MIL/uL — ABNORMAL LOW (ref 4.22–5.81)
RDW: 13.1 % (ref 11.5–15.5)
WBC: 10.5 10*3/uL (ref 4.0–10.5)

## 2015-04-28 LAB — BASIC METABOLIC PANEL
Anion gap: 9 (ref 5–15)
BUN: 26 mg/dL — ABNORMAL HIGH (ref 6–20)
CALCIUM: 9.2 mg/dL (ref 8.9–10.3)
CO2: 33 mmol/L — ABNORMAL HIGH (ref 22–32)
CREATININE: 1.01 mg/dL (ref 0.61–1.24)
Chloride: 97 mmol/L — ABNORMAL LOW (ref 101–111)
GFR calc non Af Amer: >60 mL/min (ref >60)
Glucose, Bld: 128 mg/dL — ABNORMAL HIGH (ref 65–99)
Potassium: 4.9 mmol/L (ref 3.5–5.1)
SODIUM: 139 mmol/L (ref 135–145)

## 2015-04-28 MED ORDER — LORAZEPAM 2 MG/ML IJ SOLN
1.0000 mg | INTRAMUSCULAR | Status: DC | PRN
  Administered 2015-04-28 – 2015-04-29 (×4): 1 mg via INTRAVENOUS
  Filled 2015-04-28 (×4): qty 1

## 2015-04-28 MED ORDER — LORAZEPAM 2 MG/ML IJ SOLN
0.5000 mg | Freq: Two times a day (BID) | INTRAMUSCULAR | Status: DC
  Administered 2015-04-28: 0.5 mg via INTRAVENOUS
  Filled 2015-04-28: qty 1

## 2015-04-28 MED ORDER — GUAIFENESIN ER 600 MG PO TB12: 1200.0000 mg | ORAL_TABLET | Freq: Two times a day (BID) | ORAL | Status: DC

## 2015-04-28 MED ORDER — LEVALBUTEROL HCL 1.25 MG/0.5ML IN NEBU
1.2500 mg | INHALATION_SOLUTION | RESPIRATORY_TRACT | Status: DC
  Administered 2015-04-28 – 2015-04-29 (×4): 1.25 mg via RESPIRATORY_TRACT
  Filled 2015-04-28 (×11): qty 0.5

## 2015-04-28 MED ORDER — GUAIFENESIN-DM 100-10 MG/5ML PO SYRP: 5.0000 mL | ORAL_SOLUTION | ORAL | Status: DC | PRN

## 2015-04-28 MED ORDER — LORAZEPAM 2 MG/ML IJ SOLN: 1.0000 mg | Freq: Four times a day (QID) | INTRAMUSCULAR | Status: DC | PRN

## 2015-04-28 MED ORDER — MORPHINE SULFATE 15 MG PO TABS
7.5000 mg | ORAL_TABLET | Freq: Four times a day (QID) | ORAL | Status: DC
  Administered 2015-04-28: 7.5 mg via ORAL
  Filled 2015-04-28 (×2): qty 1

## 2015-04-28 MED ORDER — METHYLPREDNISOLONE SODIUM SUCC 125 MG IJ SOLR
80.0000 mg | Freq: Four times a day (QID) | INTRAMUSCULAR | Status: DC
  Administered 2015-04-28 – 2015-04-29 (×4): 80 mg via INTRAVENOUS
  Filled 2015-04-28 (×4): qty 2

## 2015-04-28 MED ORDER — LEVALBUTEROL HCL 1.25 MG/0.5ML IN NEBU
1.2500 mg | INHALATION_SOLUTION | RESPIRATORY_TRACT | Status: DC | PRN
  Administered 2015-04-28: 1.25 mg via RESPIRATORY_TRACT
  Filled 2015-04-28 (×2): qty 0.5

## 2015-04-28 NOTE — Care Management Important Message (Signed)
Important Message  Patient Details  Name: [name] MRN: [id number] Date of Birth: 03-Apr-1945   Medicare Important Message Given:  Yes    Nathen May 04/28/2015, 11:37 AM

## 2015-04-28 NOTE — Progress Notes (Signed)
Patient ID: [name], male   DOB: Nov 05, 1945, 70 y.o.   MRN: [id number]  [facility] PROGRESS NOTE  Gregary Woodland [id number] DOB: [birth year] DOA: Apr 29, 2015 PCP: Alesia Richards, MD   Brief narrative:    70 y.o. male with COPD chronic oxygen requirement 2 L, current smoker, who presented with acute on chronic respiratory failure requiring BIPAP in the ED.   Evaluation in the ED; Ph 7.31, CO2 59, PO 2 70, Chest x ray: COPD changes no acute abnormalities.   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  Family Communication:  plan of care discussed with the patient and wife at bedside  Disposition Plan: Home by 4/7  IV access:  Peripheral IV  Procedures and diagnostic studies:    Dg Chest Port 1 View 04/29/2015  COPD changes. No acute abnormalities.   Medical Consultants:  PCCM - signed off 4/3  Other Consultants:  PT  IAnti-Infectives:   Levaquin 4/3 -->  Faye Ramsay, MD  [place] Pager [phone]  If 7PM-7AM, please contact night-coverage [email or web address] Password [email or web address] 04/28/2015, 4:44 PM   LOS: 3 days   HPI/Subjective: No events overnight.   Objective: Filed Vitals:   04/28/15 1152 04/28/15 1303 04/28/15 1500 04/28/15 1510  BP:   128/71   Pulse:   114   Temp:  97.9 F (36.6 C)     TempSrc: Oral     Resp:   32   Height:      Weight:      SpO2:  95% 100% 100%    Intake/Output Summary (Last 24 hours) at 04/28/15 1644 Last data filed at 04/28/15 0606  Gross per 24 hour  Intake      0 ml  Output    450 ml  Net   -450 ml    Exam:   General:  Pt is alert, in mild distress due to dyspnea, chocked on sip of cola this AM  Cardiovascular: Regular rate and rhythm, no rubs, no gallops  Respiratory: course breath sounds, wheezing, tachypnea and accessory muscle use   Abdomen: Soft, non tender, non distended, bowel sounds present, no guarding  Data Reviewed: Basic Metabolic Panel:  Recent Labs Lab 04/29/15 1519 04/26/15 0514 04/27/15 0342 04/28/15 0356  NA 136 138 140 139  K 4.5 5.0 5.1 4.9  CL 100* 103 101 97*  CO2 25 28 30  33*  GLUCOSE 111* 130* 126* 128*  BUN 11 18 20  26*  CREATININE 1.20 1.23 1.18 1.01  CALCIUM 9.0 9.0 9.1 9.2   Liver Function Tests:  Recent Labs Lab 04-29-2015 1519  AST 32  ALT 17  ALKPHOS 76  BILITOT 1.3*  PROT 6.9  ALBUMIN 3.6   No results for input(s): LIPASE, AMYLASE in the last 168 hours. No results for input(s): AMMONIA  in the last 168 hours. CBC:  Recent Labs Lab 05/15/2015 1519 04/26/15 0514 04/27/15 0342 04/28/15 0356  WBC 8.3 4.9 12.0* 10.5  NEUTROABS 6.1  --   --   --   HGB 12.8* 11.3* 11.3* 12.1*  HCT 38.7* 35.3* 36.0* 38.6*  MCV 97.5 97.8 98.4 99.0  PLT 167 152 155 143*     Recent Results (from the past 240 hour(s))  MRSA PCR Screening     Status: None   Collection Time: 04/26/15  3:48 AM  Result Value Ref Range Status   MRSA by PCR NEGATIVE NEGATIVE Final    Comment:        The GeneXpert MRSA Assay (FDA approved for NASAL specimens only), is one component of a comprehensive MRSA colonization surveillance program. It is not intended to diagnose MRSA infection nor to guide or monitor treatment for MRSA infections.      Scheduled Meds: . arformoterol  15 mcg  Nebulization BID  . aspirin EC  81 mg Oral Daily  . atorvastatin  40 mg Oral Daily  . budesonide-formoterol  2 puff Inhalation BID  . docusate sodium  100 mg Oral BID  . DULoxetine  60 mg Oral Daily  . enoxaparin (LOVENOX) injection  30 mg Subcutaneous Q24H  . levalbuterol  1.25 mg Nebulization Q4H  . levofloxacin (LEVAQUIN) IV  500 mg Intravenous Q24H  . LORazepam  0.5 mg Intravenous BID  . methylPREDNISolone (SOLU-MEDROL) injection  80 mg Intravenous Q6H  . morphine  7.5 mg Oral Q6H  . nicotine  21 mg Transdermal Daily  . nystatin  5 mL Oral QID  . sodium chloride flush  3 mL Intravenous Q12H   Continuous Infusions:

## 2015-04-28 NOTE — Consult Note (Signed)
Consultation Note Date: 04/28/2015   Patient Name: [name]  DOB: 15-Apr-1945  MRN: [id number]  Age / Sex: 70 y.o., male  PCP: Donald Pinto, MD Referring Physician: Theodis Blaze, MD  Reason for Consultation: Establishing goals of care  70 yo male with COPD - oxygen dependent, Lung nodules, severe protein calorie malnutrition and deconditioning, on-going tobacco use and DDD with compression fractures.  He presented to the ER in hypercarbic respiratory failure requiring BiPAP on 4/2.  [name] continues to require BiPap frequently and has been having anxiety attacks.  At times his heart rate has been as high as 150.  Respiratory who has followed the patient for the last several days notes he is declining rather than improving.  Clinical Assessment/Narrative: I met with the patient at bedside and his wife, [name].  [name] lives at home with his wife and youngest son. The Parks's have 7 children.  He retired from work as a Radio producer.  He has enjoyed golf and attending his family's sporting events.  He is [place], but has not been able to attend [facility] for the past 6 months. His wife describes a gradual decline particularly over the past year.  He is no longer able to do yard work or any work around [employer].  He has not been out of the house in 3 months.  For the past 6 months he has slept in a recliner chair because he suffers with insomnia and does not want to disturb his wife.  His wife reports that he has lost a lot of weight and is eating very little.    As the patient was on BiPap it was difficult for him to participate in the conversation.  I spoke with [name] about the trajectory of COPD and what the end stages look like.  We discussed hospice services.  We talked about end of life issues such as his Will, Durable POA, and prioritizing what he would like to accomplish in the time he has left.   [name] tells me that his goal is Quality of life over Quantity.  He wants to be productive and does not want to burden any one.  He is very happy living with the limited functionality he currently has.  His one request is to be able to leave the hospital and go to one of his children's ball games.   Last year he used to drive up in his truck and watch.  Unfortunately this year his view has been blocked by a fence and he would have to have a power wheel chair in order to see the game.  Contacts/Participants in Discussion: Wife and patient Primary Decision Maker: Patient   SUMMARY OF RECOMMENDATIONS  DNR/DNI  Scheduled PO morphine for Dyspnea with PRN IV dosing as well  Scheduled PO ativan for Anxiety and panic attacks with PRN IV dosing as well.  Patient has claustrophobia.  Focus on Quality over Quantity  Patient requests help to obtain portable oxygen and power wheel chair to see his children's ball games if he survives this hospitalization.  Code Status/Advance Care Planning: DNR   Other Directives:None  Symptom Management:   As above  Palliative Prophylaxis:   Aspiration, Bowel Regimen and frequent assessment for dyspnea  Additional Recommendations (Limitations, Scope, Preferences):  Full Scope Treatment   Psycho-social/Spiritual:  Support System: Strong Desire for further Chaplaincy support: yes Additional Recommendations: Caregiving  Support/Resources  Prognosis: < 2 weeks.  Given severe deconditioning, malnutrition, acutely decompensated end  stage COPD with appears to be impacting his heart as well.  Discharge Planning: Unable to determine.  Hospital death is certainly a possibility at this point.   Chief Complaint/ Primary Diagnoses: Present on Admission:  . COPD exacerbation ([place]) . TOBACCO USER . CKD Stage III (GFR 53 ml/min) . Respiratory failure, acute ([place])  I have reviewed the medical record, interviewed the patient and family, and examined the  patient. The following aspects are pertinent.  Past Medical History  Diagnosis Date  . Epistaxis age 30    after blunt facial trauma. needed surgical clamps and 40 units of blood  . COPD (chronic obstructive pulmonary disease) ([place])   . Hyperlipidemia   . DDD (degenerative disc disease)   . Unspecified vitamin D deficiency   . Elevated blood pressure reading without diagnosis of hypertension   . DJD (degenerative joint disease)   . Colon polyps   . Asthma   . Osteoporosis   . Oxygen deficiency     2 LITERS, STARTED 3-4 MONTHS AGO   Social History   Social History  . Marital Status: Married    Spouse Name: N/A  . Number of Children: 4  . Years of Education: N/A   Occupational History  . Retired    Social History Main Topics  . Smoking status: Current Every Day Smoker    Types: Cigarettes  . Smokeless tobacco: Never Used     Comment: 5 CIGS DAILY  . Alcohol Use: No  . Drug Use: No  . Sexual Activity: Not Asked   Other Topics Concern  . None   Social History Narrative   Family History  Problem Relation Age of Onset  . Stroke Father   . Hyperlipidemia Father   . Heart disease Father 72    deceased with cerebral aneurysm  . Heart disease Mother   . Lung disease Mother   . Asthma Mother   . Hypertension Mother   . Hyperlipidemia Mother   . Colon cancer Neg Hx   . Diabetes Sister   . Colon polyps Brother   . Leukemia Sister    Scheduled Meds: . arformoterol  15 mcg Nebulization BID  . aspirin EC  81 mg Oral Daily  . atorvastatin  40 mg Oral Daily  . budesonide-formoterol  2 puff Inhalation BID  . docusate sodium  100 mg Oral BID  . DULoxetine  60 mg Oral Daily  . enoxaparin (LOVENOX) injection  30 mg Subcutaneous Q24H  . ipratropium-albuterol  3 mL Nebulization Q6H  . levofloxacin (LEVAQUIN) IV  500 mg Intravenous Q24H  . LORazepam  0.5 mg Intravenous BID  . methylPREDNISolone (SOLU-MEDROL) injection  60 mg Intravenous Q12H  . morphine  7.5 mg Oral Q6H    . nicotine  21 mg Transdermal Daily  . nystatin  5 mL Oral QID  . sodium chloride flush  3 mL Intravenous Q12H   Continuous Infusions:  PRN Meds:.sodium chloride, acetaminophen **OR** acetaminophen, ipratropium-albuterol, LORazepam, morphine injection, ondansetron **OR** ondansetron (ZOFRAN) IV, sodium chloride flush Medications Prior to Admission:  Prior to Admission medications   Medication Sig Start Date End Date Taking? Authorizing Provider  albuterol (PROVENTIL HFA;VENTOLIN HFA) 108 (90 BASE) MCG/ACT inhaler Inhale 2 puffs into the lungs every 6 (six) hours as needed for wheezing or shortness of breath. 01/26/14  Yes Deneise Lever, MD  aspirin 81 MG tablet Take 81 mg by mouth daily.   Yes Historical Provider, MD  atorvastatin (LIPITOR) 40 MG tablet Take 1 tablet (  40 mg total) by mouth daily. 01/14/15  Yes Donald Pinto, MD  budesonide-formoterol [facility]) 160-4.5 MCG/ACT inhaler Inhale 2 puffs into the lungs 2 (two) times daily. rinse mouth 01/07/15 01/07/16 Yes Deneise Lever, MD  Cholecalciferol (VITAMIN D PO) Take 5,000 Units by mouth.   Yes Historical Provider, MD  DULoxetine (CYMBALTA) 60 MG capsule Take 1 capsule daily for pain 04/13/15 11/13/15 Yes Donald Pinto, MD  ipratropium-albuterol (DUONEB) 0.5-2.5 (3) MG/3ML SOLN Take 3 mLs by nebulization every 4 (four) hours as needed. Patient taking differently: Take 3 mLs by nebulization every 4 (four) hours as needed. For wheezing & shortness of breath 01/07/15  Yes Deneise Lever, MD  ibuprofen (ADVIL,MOTRIN) 200 MG tablet Take 400 mg by mouth every 6 (six) hours as needed for headache or moderate pain.     Historical Provider, MD   No Known Allergies  Review of Systems:  Denies constipation, dysuria, pain.  Complains of insomnia, anxiety, dyspnea  Physical Exam  Constitutional: He appears listless. He appears cachectic. He is cooperative. He appears ill. He appears distressed.  Cardiovascular: An irregular rhythm present.  Tachycardia present.  Exam reveals gallop.   Respiratory: Accessory muscle usage present. Tachypnea noted. He is in respiratory distress.  GI: Soft. Bowel sounds are normal. He exhibits no distension and no mass.  Neurological: He appears listless.  Psychiatric: His mood appears anxious. His speech is rapid and/or pressured.    Vital Signs: BP 155/89 mmHg  Pulse 125  Temp(Src) 97.9 F (36.6 C) (Oral)  Resp 27  Ht _0  (1.753 m)  Wt 59.1 kg (130 lb 4.7 oz)  BMI 19.23 kg/m2  SpO2 96%  SpO2: SpO2: 96 % O2 Device:SpO2: 96 % O2 Flow Rate: .O2 Flow Rate (L/min): 5 L/min  IO: Intake/output summary:   Intake/Output Summary (Last 24 hours) at 04/28/15 1218 Last data filed at 04/28/15 0606  Gross per 24 hour  Intake      0 ml  Output    600 ml  Net   -600 ml    LBM: Last BM Date: 04/24/15 Baseline Weight: Weight: 59.1 kg (130 lb 4.7 oz) Most recent weight: Weight: 59.1 kg (130 lb 4.7 oz)      Palliative Assessment/Data:  Flowsheet Rows        Most Recent Value   Intake Tab    Referral Department  Hospitalist   Unit at Time of Referral  Intermediate Care Unit   Palliative Care Primary Diagnosis  Pulmonary   Date Notified  04/28/15   Palliative Care Type  New Palliative care   Reason for referral  Clarify Goals of Care   Date of Admission  04/29/2015   Date first seen by Palliative Care  04/28/15   # of days Palliative referral response time  0 Day(s)   # of days IP prior to Palliative referral  3   Clinical Assessment    Palliative Performance Scale Score  30%   Dyspnea Max Last 24 Hours  9   Dyspnea Min Last 24 hours  5   Anxiety Max Last 24 Hours  9   Anxiety Min Last 24 Hours  5   Psychosocial & Spiritual Assessment    Palliative Care Outcomes    Patient/Family meeting held?  Yes   Who was at the meeting?  wife, patient   Palliative Care Outcomes  Improved non-pain symptom therapy, Clarified goals of care, Counseled regarding hospice      Additional Data  Reviewed:  CBC:  Component Value Date/Time   WBC 10.5 04/28/2015 0356   WBC 5.8 03/25/2010 1326   HGB 12.1* 04/28/2015 0356   HGB 13.1 03/25/2010 1326   HCT 38.6* 04/28/2015 0356   HCT 38.5 03/25/2010 1326   PLT 143* 04/28/2015 0356   PLT 166 03/25/2010 1326   MCV 99.0 04/28/2015 0356   MCV 95.4 03/25/2010 1326   NEUTROABS 6.1 05/05/2015 1519   NEUTROABS 3.6 03/25/2010 1326   LYMPHSABS 1.1 05/04/2015 1519   LYMPHSABS 1.2 03/25/2010 1326   MONOABS 0.8 05/22/2015 1519   MONOABS 0.8 03/25/2010 1326   EOSABS 0.2 05/04/2015 1519   EOSABS 0.3 03/25/2010 1326   BASOSABS 0.0 05/10/2015 1519   BASOSABS 0.0 03/25/2010 1326   Comprehensive Metabolic Panel:    Component Value Date/Time   NA 139 04/28/2015 0356   K 4.9 04/28/2015 0356   CL 97* 04/28/2015 0356   CO2 33* 04/28/2015 0356   BUN 26* 04/28/2015 0356   CREATININE 1.01 04/28/2015 0356   CREATININE 1.17 04/13/2015 1059   GLUCOSE 128* 04/28/2015 0356   CALCIUM 9.2 04/28/2015 0356   AST 32 04/24/2015 1519   ALT 17 05/13/2015 1519   ALKPHOS 76 05/16/2015 1519   BILITOT 1.3* 05/04/2015 1519   PROT 6.9 05/08/2015 1519   ALBUMIN 3.6 05/23/2015 1519     Time In: 11:00  Time Out: 12:10 Time Total: 70 min. Greater than 50%  of this time was spent counseling and coordinating care related to the above assessment and plan.  Signed by:  Melton Alar, PA-C  04/28/2015, 12:18 PM  Please contact Palliative Medicine Team phone at [phone] for questions and concerns.

## 2015-04-28 NOTE — Progress Notes (Signed)
Pt off bipap per his request. On 5L [place]. RT will continue to monitor.

## 2015-04-29 ENCOUNTER — Other Ambulatory Visit: Payer: Self-pay | Admitting: Internal Medicine

## 2015-04-29 DIAGNOSIS — F411 Generalized anxiety disorder: Secondary | ICD-10-CM

## 2015-04-29 DIAGNOSIS — Z7189 Other specified counseling: Secondary | ICD-10-CM | POA: Insufficient documentation

## 2015-04-29 DIAGNOSIS — Z515 Encounter for palliative care: Secondary | ICD-10-CM | POA: Insufficient documentation

## 2015-04-29 DIAGNOSIS — R451 Restlessness and agitation: Secondary | ICD-10-CM | POA: Insufficient documentation

## 2015-04-29 LAB — BASIC METABOLIC PANEL
ANION GAP: 13 (ref 5–15)
BUN: 40 mg/dL — ABNORMAL HIGH (ref 6–20)
CALCIUM: 9.2 mg/dL (ref 8.9–10.3)
CO2: 31 mmol/L (ref 22–32)
Chloride: 95 mmol/L — ABNORMAL LOW (ref 101–111)
Creatinine, Ser: 1.13 mg/dL (ref 0.61–1.24)
Glucose, Bld: 134 mg/dL — ABNORMAL HIGH (ref 65–99)
Potassium: 5.6 mmol/L — ABNORMAL HIGH (ref 3.5–5.1)
Sodium: 139 mmol/L (ref 135–145)

## 2015-04-29 LAB — CBC
HCT: 41.9 % (ref 39.0–52.0)
Hemoglobin: 12.7 g/dL — ABNORMAL LOW (ref 13.0–17.0)
MCH: 31.1 pg (ref 26.0–34.0)
MCHC: 30.3 g/dL (ref 30.0–36.0)
MCV: 102.4 fL — ABNORMAL HIGH (ref 78.0–100.0)
PLATELETS: 154 10*3/uL (ref 150–400)
RBC: 4.09 MIL/uL — ABNORMAL LOW (ref 4.22–5.81)
RDW: 13 % (ref 11.5–15.5)
WBC: 14.8 10*3/uL — AB (ref 4.0–10.5)

## 2015-04-29 MED ORDER — SODIUM CHLORIDE 0.9% FLUSH
3.0000 mL | Freq: Two times a day (BID) | INTRAVENOUS | Status: DC
  Administered 2015-04-29: 3 mL via INTRAVENOUS

## 2015-04-29 MED ORDER — SODIUM CHLORIDE 0.9% FLUSH: 3.0000 mL | INTRAVENOUS | Status: DC | PRN

## 2015-04-29 MED ORDER — GLYCOPYRROLATE 0.2 MG/ML IJ SOLN: 0.2000 mg | INTRAMUSCULAR | Status: DC | PRN

## 2015-04-29 MED ORDER — BIOTENE DRY MOUTH MT LIQD: 15.0000 mL | OROMUCOSAL | Status: DC | PRN

## 2015-04-29 MED ORDER — MORPHINE SULFATE (PF) 2 MG/ML IV SOLN: 1.0000 mg | INTRAVENOUS | Status: DC | PRN

## 2015-04-29 MED ORDER — HALOPERIDOL 0.5 MG PO TABS
0.5000 mg | ORAL_TABLET | ORAL | Status: DC | PRN
  Filled 2015-04-29: qty 1

## 2015-04-29 MED ORDER — MORPHINE SULFATE (PF) 2 MG/ML IV SOLN
1.0000 mg | INTRAVENOUS | Status: DC | PRN
  Administered 2015-04-29: 2 mg via INTRAVENOUS
  Filled 2015-04-29: qty 1

## 2015-04-29 MED ORDER — GLYCOPYRROLATE 1 MG PO TABS: 1.0000 mg | ORAL_TABLET | ORAL | Status: DC | PRN

## 2015-04-29 MED ORDER — SODIUM CHLORIDE 0.9 % IV SOLN
2.0000 mg/h | INTRAVENOUS | Status: DC
  Administered 2015-04-29: 2 mg/h via INTRAVENOUS
  Filled 2015-04-29: qty 10

## 2015-04-29 MED ORDER — HALOPERIDOL LACTATE 5 MG/ML IJ SOLN: 0.5000 mg | INTRAMUSCULAR | Status: DC | PRN

## 2015-04-29 MED ORDER — SODIUM CHLORIDE 0.9 % IV SOLN: 250.0000 mL | INTRAVENOUS | Status: DC | PRN

## 2015-04-29 MED ORDER — GLYCOPYRROLATE 0.2 MG/ML IJ SOLN
0.4000 mg | Freq: Four times a day (QID) | INTRAMUSCULAR | Status: DC
  Administered 2015-04-29 – 2015-04-30 (×3): 0.4 mg via INTRAVENOUS
  Filled 2015-04-29 (×3): qty 2

## 2015-04-29 MED ORDER — MIDAZOLAM HCL 2 MG/2ML IJ SOLN
2.0000 mg | INTRAMUSCULAR | Status: DC | PRN
  Administered 2015-04-29: 2 mg via INTRAVENOUS
  Filled 2015-04-29: qty 2

## 2015-04-29 MED ORDER — HALOPERIDOL LACTATE 2 MG/ML PO CONC
0.5000 mg | ORAL | Status: DC | PRN
  Filled 2015-04-29: qty 0.3

## 2015-04-29 MED ORDER — MORPHINE BOLUS VIA INFUSION
2.0000 mg | INTRAVENOUS | Status: DC | PRN
  Filled 2015-04-29: qty 2

## 2015-04-29 MED ORDER — POLYVINYL ALCOHOL 1.4 % OP SOLN: 1.0000 [drp] | Freq: Four times a day (QID) | OPHTHALMIC | Status: DC | PRN

## 2015-04-29 MED ORDER — MORPHINE SULFATE 25 MG/ML IV SOLN
2.0000 mg/h | INTRAVENOUS | Status: DC
  Administered 2015-04-29 (×3): 2 mg/h via INTRAVENOUS
  Filled 2015-04-29: qty 10

## 2015-04-29 MED ORDER — HALOPERIDOL LACTATE 5 MG/ML IJ SOLN
0.5000 mg | INTRAMUSCULAR | Status: DC | PRN
  Administered 2015-04-29: 0.5 mg via INTRAVENOUS
  Filled 2015-04-29: qty 1

## 2015-04-29 MED ORDER — HALOPERIDOL LACTATE 2 MG/ML PO CONC
0.5000 mg | ORAL | Status: DC | PRN
Start: 2015-04-29 — End: 2015-04-29

## 2015-04-29 NOTE — Progress Notes (Signed)
Nutrition Brief Note  Chart reviewed due to low Braden score. Pt receiving comfort care.  No nutrition interventions warranted at this time.  Please consult as needed.   Molli Barrows, RD, LDN, [place] Pager [phone] After Hours Pager [phone]

## 2015-04-29 NOTE — Progress Notes (Signed)
Patient ID: [name], male   DOB: 1945-04-01, 70 y.o.   MRN: [id number]  [facility] PROGRESS NOTE  Elroy Bachtell [id number] DOB: [birth year] DOA: 2015/05/23 PCP: Alesia Richards, MD   Brief narrative:    70 y.o. male with COPD chronic oxygen requirement 2 L, current smoker, who presented with acute on chronic respiratory failure requiring BIPAP in the ED.   Evaluation in the ED; Ph 7.31, CO2 59, PO 2 70, Chest x ray: COPD changes no acute abnormalities.   Assessment/Plan:    Acute on chronic hypercarbicrespiratory failure in the setting of AECOPD - with known FEV1 is 18% and Severe Deconditioning - in respiratory distress this AM and RR in 30's, needs BiPAP but has refused - d/w family continued deterioration despite solumedrol and BDs, ABX - PCT consulted and assistance is appreciated  - recommended comfort   Severe PCM - in the context of acute on chronic illness - nutritionist consulted  - currently too confused and agitated to take anything PO   Acute encephalopathy - delirium, agitation - looks rather uncomfortable this AM and in resp distress - discussed with family focusing on ensuring comfort, awaiting for son to arrive for further discussions   Hyperkalemia - unclear etiology - monitor   Tachycardia - from respiratory distress - trying to focus on comfort would likely help with stabilizing heart rate   Tobacco use - placed on Nicotine patch  Depression - resumed home medical regimen - currently unable to take anything PO    DVT prophylaxis - Lovenox SQ  Code Status: DNR Family Communication:  plan of care discussed with wife at bedside  Disposition Plan: Currently not able to tell, ? hospice   IV access:  Peripheral IV  Procedures and diagnostic studies:    Dg Chest Port 1 View 05-23-15  COPD changes. No acute abnormalities.   Medical Consultants:  PCCM - signed off 4/3 PCT  Other Consultants:  PT  IAnti-Infectives:    Levaquin 4/3 -->  Faye Ramsay, MD  [facility] Pager [phone]  If 7PM-7AM, please contact night-coverage [email or web address] Password [email or web address] 04/29/2015, 2:00 PM   LOS: 4 days   HPI/Subjective: Worse this AM, more dyspnea.    Objective: Filed Vitals:   04/29/15 0819 04/29/15 1153 04/29/15 1157 04/29/15 1204  BP:   145/85   Pulse:   131   Temp: 98.7 F (37.1 C)   98.2 F (36.8 C)  TempSrc: Axillary   Axillary  Resp:   30   Height:      Weight:      SpO2:  99% 100%     Intake/Output Summary (Last 24 hours) at 04/29/15 1400 Last data filed at 04/29/15 0859  Gross per 24 hour  Intake      0 ml  Output    400 ml  Net   -400 ml    Exam:   General:  Pt is confused, agitated, in distress due to dyspnea   Cardiovascular: Regular rhythm, tachycardic, no rubs, no gallops  Respiratory: course breath sounds, wheezing, tachypnea and accessory muscle use   Abdomen: Soft, non tender, non distended  Data Reviewed: Basic Metabolic Panel:  Recent Labs Lab 05/23/15 1519 04/26/15 0514 04/27/15 0342 04/28/15 0356 04/29/15 0403  NA 136 138 140 139 139  K 4.5 5.0 5.1 4.9 5.6*  CL 100* 103 101 97* 95*  CO2 25 28 30  33* 31  GLUCOSE 111* 130* 126* 128* 134*  BUN 11 18 20  26* 40*  CREATININE 1.20 1.23  1.18 1.01 1.13  CALCIUM 9.0 9.0 9.1 9.2 9.2   Liver Function Tests:  Recent Labs Lab 05/02/2015 1519  AST 32  ALT 17  ALKPHOS 76  BILITOT 1.3*  PROT 6.9  ALBUMIN 3.6   CBC:  Recent Labs Lab 05/10/2015 1519 04/26/15 0514 04/27/15 0342 04/28/15 0356 04/29/15 0403  WBC 8.3 4.9 12.0* 10.5 14.8*  NEUTROABS 6.1  --   --   --   --   HGB 12.8* 11.3* 11.3* 12.1* 12.7*  HCT 38.7* 35.3* 36.0* 38.6* 41.9  MCV 97.5 97.8 98.4 99.0 102.4*  PLT 167 152 155 143* 154     Recent Results (from the past 240 hour(s))  MRSA PCR Screening     Status: None   Collection Time: 04/26/15  3:48 AM  Result Value Ref Range Status   MRSA by PCR NEGATIVE NEGATIVE Final    Comment:         The GeneXpert MRSA Assay (FDA approved for NASAL specimens only), is one component of a comprehensive MRSA colonization surveillance program. It is not intended to diagnose MRSA infection nor to guide or monitor treatment for MRSA infections.      Scheduled Meds: . DULoxetine  60 mg Oral Daily  . glycopyrrolate  0.4 mg Intravenous Q6H  . levalbuterol  1.25 mg Nebulization Q4H  . nicotine  21 mg Transdermal Daily  . nystatin  5 mL Oral QID  . sodium chloride flush  3 mL Intravenous Q12H  . sodium chloride flush  3 mL Intravenous Q12H   Continuous Infusions:

## 2015-04-29 NOTE — Care Management Note (Signed)
Case Management Note  Patient Details  Name: [name] MRN: [id number] Date of Birth: [birth year]  Subjective/Objective:     Patient is actively dying, probably expect hospital death, if stabilizing may transfer to [facility] [address] or residential hospice. NCM will cont to follow.                Action/Plan:   Expected Discharge Date:  05/11/2015               Expected Discharge Plan:  [place]  In-House Referral:  Clinical Social Work  Discharge planning Services  CM Consult  Post Acute Care Choice:  Hospice Choice offered to:     DME Arranged:    DME Agency:     HH Arranged:    [place] Agency:     Status of Service:  In process, will continue to follow  Medicare Important Message Given:  Yes Date Medicare IM Given:    Medicare IM give by:    Date Additional Medicare IM Given:    Additional Medicare Important Message give by:     If discussed at [place] of Stay Meetings, dates discussed:    Additional Comments:  Zenon Mayo, RN 04/29/2015, 3:08 PM

## 2015-04-29 NOTE — Consult Note (Addendum)
   [facility] CM Inpatient Consult   04/29/2015  [name] 04/02/1945 [id number] Chart screened for [name] Management services through his [employer] Medicare plan.  Patient is currently not appropriate for community care management with [facility].  Spoke with inpatient RNCM and she indicates patient is being followed by Hospice and Palliative services, not a candidate.   Natividad Brood, RN BSN [facility] Liaison  [phone] business mobile phone Toll free office [phone]

## 2015-04-29 NOTE — Progress Notes (Signed)
Daily Progress Note   Patient Name: [name]       Date: 04/29/2015 DOB: 1945-01-29  Age: 70 y.o. MRN#: [id number] Attending Physician: Theodis Blaze, MD Primary Care Physician: Alesia Richards, MD Admit Date: 05/17/2015  Reason for Consultation/Follow-up: Non pain symptom management and Terminal Care  Subjective: N/A  Interval Events:  Patient has declined significantly.  His mental status is altered.  He appears agitated.  His pulse rate is consistently above 120 - 130s.  He is having significant retractions but is unable to take a deep breath or speak.  Wife, daughter and step daughter are at bed side.  Son is on his way to the hospital.  Dr. Doyle Askew has seen the patient and conferred with pulmonology.  The best we have to offer at this point is comfort care.   Length of Stay: 4 days  Current Medications: Scheduled Meds:  . DULoxetine  60 mg Oral Daily  . glycopyrrolate  0.4 mg Intravenous Q6H  . levalbuterol  1.25 mg Nebulization Q4H  . nicotine  21 mg Transdermal Daily  . nystatin  5 mL Oral QID  . sodium chloride flush  3 mL Intravenous Q12H  . sodium chloride flush  3 mL Intravenous Q12H    Continuous Infusions:    PRN Meds: sodium chloride, sodium chloride, acetaminophen **OR** acetaminophen, antiseptic oral rinse, [DISCONTINUED] glycopyrrolate **OR** [DISCONTINUED] glycopyrrolate **OR** glycopyrrolate, haloperidol **OR** [DISCONTINUED] haloperidol **OR** [DISCONTINUED] haloperidol lactate, levalbuterol, midazolam, morphine injection, ondansetron **OR** ondansetron (ZOFRAN) IV, polyvinyl alcohol, sodium chloride flush, sodium chloride flush    Physical Exam  Constitutional: He appears distressed.  Pulmonary/Chest: Accessory muscle usage present. Tachypnea noted.  He is in respiratory distress. He has rales.  Abdominal: He exhibits no distension and no mass. There is no tenderness.  Neurological:  Awake agitated. restless  Psychiatric:  Agitated, confused, unable to speak with intelligible words.                Vital Signs: BP 127/63 mmHg  Pulse 114  Temp(Src) 98.2 F (36.8 C) (Axillary)  Resp 21  Ht 5\' 9"  (1.753 m)  Wt 59.1 kg (130 lb 4.7 oz)  BMI 19.23 kg/m2  SpO2 99% SpO2: SpO2: 99 % O2 Device: O2 Device: Nasal Cannula O2 Flow Rate: O2 Flow Rate (L/min): 6 L/min  Intake/output summary:  Intake/Output Summary (Last 24 hours) at 04/29/15 1220 Last data filed at 04/29/15 0859  Gross per 24 hour  Intake      0 ml  Output    400 ml  Net   -400 ml   LBM: Last BM Date: 04/24/15 Baseline Weight: Weight: 59.1 kg (130 lb 4.7 oz) Most recent weight: Weight: 59.1 kg (130 lb 4.7 oz)       Palliative Assessment/Data: Flowsheet Rows        Most Recent Value   Intake Tab    Referral Department  Hospitalist   Unit at Time of Referral  Intermediate Care Unit   Palliative Care Primary Diagnosis  Pulmonary   Date Notified  04/28/15   Palliative Care Type  New Palliative care   Reason for referral  Clarify Goals of Care   Date of Admission  04/29/2015   Date first seen by Palliative Care  04/28/15   # of days Palliative referral response time  0 Day(s)   # of days IP prior to Palliative referral  3   Clinical Assessment    Palliative Performance Scale Score  10%   Dyspnea Max Last 24 Hours  9   Dyspnea Min Last 24 hours  5   Anxiety Max Last 24 Hours  9   Anxiety Min Last 24 Hours  5   Psychosocial & Spiritual Assessment    Palliative Care Outcomes    Patient/Family meeting held?  Yes   Who was at the meeting?  wife, patient   Palliative Care Outcomes  Improved non-pain symptom therapy, Clarified goals of care, Counseled regarding hospice      Additional Data Reviewed: CBC    Component Value Date/Time   WBC 14.8* 04/29/2015 0403    WBC 5.8 03/25/2010 1326   RBC 4.09* 04/29/2015 0403   RBC 3.90* 02/16/2014 1650   RBC 4.03* 03/25/2010 1326   HGB 12.7* 04/29/2015 0403   HGB 13.1 03/25/2010 1326   HCT 41.9 04/29/2015 0403   HCT 38.5 03/25/2010 1326   PLT 154 04/29/2015 0403   PLT 166 03/25/2010 1326   MCV 102.4* 04/29/2015 0403   MCV 95.4 03/25/2010 1326   MCH 31.1 04/29/2015 0403   MCH 32.4 03/25/2010 1326   MCHC 30.3 04/29/2015 0403   MCHC 33.9 03/25/2010 1326   RDW 13.0 04/29/2015 0403   RDW 13.4 03/25/2010 1326   LYMPHSABS 1.1 05/20/2015 1519   LYMPHSABS 1.2 03/25/2010 1326   MONOABS 0.8 05/03/2015 1519   MONOABS 0.8 03/25/2010 1326   EOSABS 0.2 04/27/2015 1519   EOSABS 0.3 03/25/2010 1326   BASOSABS 0.0 05/16/2015 1519   BASOSABS 0.0 03/25/2010 1326    CMP     Component Value Date/Time   NA 139 04/29/2015 0403   K 5.6* 04/29/2015 0403   CL 95* 04/29/2015 0403   CO2 31 04/29/2015 0403   GLUCOSE 134* 04/29/2015 0403   BUN 40* 04/29/2015 0403   CREATININE 1.13 04/29/2015 0403   CREATININE 1.17 04/13/2015 1059   CALCIUM 9.2 04/29/2015 0403   PROT 6.9 05/18/2015 1519   ALBUMIN 3.6 05/05/2015 1519   AST 32 05/07/2015 1519   ALT 17 05/23/2015 1519   ALKPHOS 76 04/24/2015 1519   BILITOT 1.3* 05/21/2015 1519   GFRNONAA >60 04/29/2015 0403   GFRNONAA 64 04/13/2015 1059   GFRAA >60 04/29/2015 0403   GFRAA 74 04/13/2015 1059       Problem List:  Patient Active Problem List   Diagnosis Date Noted  .  Dyspnea   . Anxiety state   . Palliative care encounter   . Goals of care, counseling/discussion   . COPD exacerbation ([place]) 05/21/2015  . Acute on chronic respiratory failure ([place]) 05/16/2015  . Respiratory failure, acute ([place]) 05/21/2015  . Body mass index (BMI) of 20.0-20.9 in adult 12/05/2014  . Lung nodules 02/22/2014  . History of colonic polyps 12/10/2013  . Peripheral arterial disease ([place]) 12/05/2013  . CKD Stage III (GFR 53 ml/min) 04/13/2013  . Prediabetes 03/31/2013  .  Medication management 03/31/2013  . Hyperlipidemia   . Vitamin D deficiency   . Labile Hypertension   . DDD (degenerative disc disease)   . Vertebral compression fracture ([place]) 06/25/2010  . TOBACCO USER 03/03/2009  . 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 Goals of Care/Additional Recommendations:  DNR / DNI  Limitations on Scope of Treatment: Full Comfort Care  Desire for further Chaplaincy support: Yes  Psycho-social Needs: Caregiving  Support/Resources and Grief/Bereavement Support  3. Symptom Management:      Dyspnea and pain:  Morphine PRN      Terminal Agitation:  Versed  4. Palliative Prophylaxis:   Aspiration, Delirium Protocol and frequent assesment for agitation, dyspnea  5. Prognosis: Hours - Days  6. Discharge Planning:  Anticipated Hospital Death may consider Hospice House if he settles down in the next 24 hours.  Will transfer to [facility] to see if he stabilizes.   Care plan was discussed with family at bedside, [facility] MD, Bedside RN  Thank you for allowing the Palliative Medicine Team to assist in the care of this patient.   Time In: 11:00 Time Out: 12:00 Total Time 60 min Prolonged Time Billed no        Melton Alar, PA-C  04/29/2015, 12:20 PM  Please contact Palliative Medicine Team phone at [phone] for questions and concerns.

## 2015-04-29 NOTE — Progress Notes (Signed)
No charge note.  Patient agitated despite multiple (8+) family members in the room.  Patient's family chaplain at bedside.  Our [facility] Chaplain involved as well & we appreciate their support.  Patient is still agitated despite PRN doses of Morphine and Versed.    Will start low dose infusion of morphine and versed.  Our entire focus is on this patent's comfort and support of his family.  Imogene Burn, [place] [facility] Pager: [phone]

## 2015-04-29 NOTE — Progress Notes (Signed)
   04/29/15 1400  Clinical Encounter Type  Visited With Family;Patient not available  Visit Type Patient actively dying;Critical Care  Referral From Palliative care team  Spiritual Encounters  Spiritual Needs Emotional;Grief support  Ch responded to consult from PCT regarding EOL for pt; pt is actively dying prayer offered. Spiritual, grief and emotional support offered. [place] available as needed.  Family pastor has been contacted and is on the way. Gwynn Burly 2:05 PM

## 2015-04-29 NOTE — Progress Notes (Signed)
   04/29/15 1400  Clinical Encounter Type  Visited With Family;Patient not available  Visit Type Patient actively dying;Critical Care  Referral From Palliative care team  Spiritual Encounters  Spiritual Needs Emotional;Grief support  Escorted family to [facility]; Offered additional and continual emotional and grief support; large and engaged family; recommend access as best as possible as likely pt will not last long per Palliative Care Team; Contact [facility] [phone] for any additional support. Gwynn Burly 4:42 PM

## 2015-04-29 NOTE — Progress Notes (Signed)
Patient to transfer to [phone] report given to receiving nurse, all questions answered at this time.  Patients family escorted by chaplain.  Patients wife escorted with patient.

## 2015-05-24 NOTE — Progress Notes (Signed)
Pt expired at 0400 hrs. Pronounced by 2 RNs. Death was expected and pt was comfort care. Death certificate completed.

## 2015-05-24 NOTE — Discharge Summary (Signed)
  Death Summary  [name] [id number] DOB: 1945-04-13 DOA: 2015-05-07  PCP: Alesia Richards, MD PCP/Office notified:   Admit date: 05/07/15 Date of Death: 05-12-2015 at 0400  Final Diagnoses:  Active Problems:   TOBACCO USER   CKD Stage III (GFR 53 ml/min)   COPD exacerbation (HCC)   Acute on chronic respiratory failure (HCC)   Respiratory failure, acute (HCC)   Dyspnea   Anxiety state   Palliative care encounter   Goals of care, counseling/discussion   End of life care   Encounter for hospice care discussion   Agitation   Encounter for palliative care    Brief narrative:    70 y.o. male with COPD chronic oxygen requirement 2 L, current smoker, who presented with acute on chronic respiratory failure requiring BIPAP in the ED.   Evaluation in the ED; Ph 7.31, CO2 59, PO 2 70, Chest x ray: COPD changes no acute abnormalities.   Assessment/Plan:    Acute on chronic hypercarbicrespiratory failure in the setting of AECOPD - with known FEV1 is 18% and Severe Deconditioning - has been in respiratory distress and has refused BiPAP - d/w family continued deterioration despite solumedrol and BDs, ABX - PCT consulted and assistance is appreciated  - focus on comfort desired per pt and family   Severe PCM - in the context of acute on chronic illness  Acute encephalopathy - delirium, agitation - focus on full comfort   Hyperkalemia - no further blood work to ensure comfort   Tachycardia - from respiratory distress  Tobacco use  Depression  DVT prophylaxis - Lovenox SQ  Code Status: DNR Family Communication: plan of care discussed with wife at bedside   IV access:  Peripheral IV  Procedures and diagnostic studies:   Dg Chest Port 1 View 2015/05/07 COPD changes. No acute abnormalities.   Medical Consultants:  PCCM - signed off 4/3 PCT  Other Consultants:  PT  IAnti-Infectives:   Levaquin 4/3 -->  Faye Ramsay,  MD [facility] Pager [phone]        Signed:  Faye Ramsay  [facility] 05/05/2015, 11:06 PM [phone]

## 2015-05-24 NOTE — Progress Notes (Signed)
Wasted morphine=245cc and versed=12.5cc, wasted by Buffey Zabinski,RN and witnessed by [place]

## 2015-05-24 DEATH — deceased

## 2015-07-08 ENCOUNTER — Ambulatory Visit: Payer: Commercial Managed Care - HMO | Admitting: Internal Medicine

## 2015-07-23 ENCOUNTER — Ambulatory Visit: Payer: Self-pay | Admitting: Internal Medicine

## 2015-10-06 IMAGING — PT NM PET TUM IMG INITIAL (PI) SKULL BASE T - THIGH
1 of 8 series · 1 of 25 positions shown · non-contrast
Comparison: Chest CT of 01/10/2014.

CLINICAL DATA: Initial treatment strategy for evaluate lung
nodules. Smoker..

EXAM:
NUCLEAR MEDICINE PET SKULL BASE TO THIGH
TECHNIQUE: 7.2 mCi F-18 FDG was injected intravenously. Full-ring PET imaging
was performed from the skull base to thigh after the radiotracer. CT
data was obtained and used for attenuation correction and anatomic
localization.
FASTING BLOOD GLUCOSE:  Value: Eighty-seven mg/dl

[Series 4: ct sk_thigh 5.0 b31f · axial · 5.0mm · 0.88mm/px · 1 of 229 slices shown]
[im 229/229  brain]
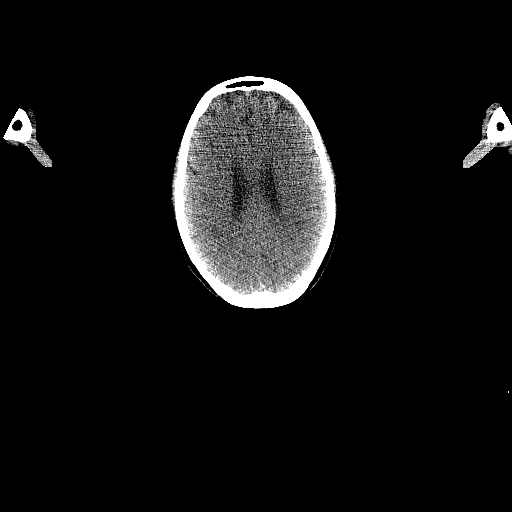

[1 of 25 positions shown; findings below may reference images not displayed]

02/20/2014 thoracic spine CT
(demonstrated a 4 mm left lower lobe pulmonary nodule).
FINDINGS: Muscular activity is seen throughout the neck, chest, abdomen, and
pelvis. This is presumably due to motion after radiopharmaceutical
injection.

NECK

No areas of abnormal hypermetabolism.

CHEST

No areas of abnormal hypermetabolism. The left lower lobe 4 mm
nodule described on the prior thoracic spine CT is below the
resolution of PET. No thoracic nodal activity.

ABDOMEN/PELVIS

No areas of abnormal hypermetabolism.

SKELETON

Mild hypermetabolism involves the T9 vertebral body. This
corresponds to a mild compression deformity.

CT IMAGES PERFORMED FOR ATTENUATION CORRECTION

No cervical adenopathy. Carotid atherosclerosis. LAD Coronary artery
atherosclerosis. Moderate centrilobular emphysema. 3 mm left lower
lobe pulmonary nodule on image 68, present on the 01/10/2014 CT. The
more central left lower lobe nodule is unchanged on image 43 and
measures 4 mm.
IMPRESSION: 1. 4 mm left lower lobe pulmonary nodule is below the resolution of
PET. A smaller left lower lobe nodule is also indeterminate and
below PET resolution. Both or similar back to 01/10/2014. Per
consensus criteria, followup with chest CT at 1 year is recommended.
This recommendation follows the consensus statement: Guidelines for
Management of Small Pulmonary Nodules Detected on CT Scans: A
Statement from the [HOSPITAL] as published in Radiology
4447; [DATE].
2.  Atherosclerosis, including within the coronary arteries.
3. Mild hypermetabolism corresponding to a mild T9 vertebral
compression fracture.
4. Emphysema.

## 2015-10-26 ENCOUNTER — Ambulatory Visit: Payer: Self-pay | Admitting: Internal Medicine

## 2016-05-02 ENCOUNTER — Encounter: Payer: Self-pay | Admitting: Internal Medicine
